# Patient Record
Sex: Female | Born: 1990 | Race: White | Hispanic: No | Marital: Married | Smoking: Current every day smoker
Health system: Southern US, Community
[De-identification: ages and names within clinical notes are randomized; demographics above are authoritative.]

## PROBLEM LIST (undated history)

## (undated) ENCOUNTER — Inpatient Hospital Stay: Payer: Self-pay

## (undated) DIAGNOSIS — F419 Anxiety disorder, unspecified: Secondary | ICD-10-CM

## (undated) DIAGNOSIS — F99 Mental disorder, not otherwise specified: Secondary | ICD-10-CM

## (undated) HISTORY — DX: Mental disorder, not otherwise specified: F99

## (undated) HISTORY — DX: Anxiety disorder, unspecified: F41.9

## (undated) HISTORY — PX: TONSILLECTOMY: SUR1361

---
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
## 2010-02-07 ENCOUNTER — Emergency Department: Payer: Self-pay | Admitting: Emergency Medicine

## 2010-02-14 ENCOUNTER — Emergency Department: Payer: Self-pay | Admitting: Emergency Medicine

## 2010-03-07 ENCOUNTER — Emergency Department: Payer: Self-pay | Admitting: Emergency Medicine

## 2011-10-25 ENCOUNTER — Emergency Department: Payer: Self-pay | Admitting: Emergency Medicine

## 2011-10-25 LAB — URINALYSIS, COMPLETE
Bilirubin,UR: NEGATIVE
Blood: NEGATIVE
Glucose,UR: NEGATIVE mg/dL (ref 0–75)
Nitrite: NEGATIVE
Ph: 6 (ref 4.5–8.0)
Protein: NEGATIVE
Specific Gravity: 1.029 (ref 1.003–1.030)
Squamous Epithelial: 7
WBC UR: 5 /HPF (ref 0–5)

## 2011-10-25 LAB — COMPREHENSIVE METABOLIC PANEL
Alkaline Phosphatase: 71 U/L (ref 50–136)
Anion Gap: 12 (ref 7–16)
BUN: 11 mg/dL (ref 7–18)
Chloride: 107 mmol/L (ref 98–107)
Co2: 24 mmol/L (ref 21–32)
Creatinine: 0.4 mg/dL — ABNORMAL LOW (ref 0.60–1.30)
EGFR (African American): >60
EGFR (Non-African Amer.): >60
Glucose: 83 mg/dL (ref 65–99)
Osmolality: 284 (ref 275–301)
Potassium: 4.7 mmol/L (ref 3.5–5.1)
SGOT(AST): 27 U/L (ref 15–37)
SGPT (ALT): 18 U/L

## 2011-10-25 LAB — CBC
HCT: 41.8 % (ref 35.0–47.0)
HGB: 13.7 g/dL (ref 12.0–16.0)
MCH: 28.2 pg (ref 26.0–34.0)
MCV: 86 fL (ref 80–100)
RBC: 4.86 10*6/uL (ref 3.80–5.20)

## 2011-10-25 LAB — WET PREP, GENITAL

## 2011-10-25 LAB — PREGNANCY, URINE: Pregnancy Test, Urine: POSITIVE m[IU]/mL

## 2011-10-26 LAB — HCG, QUANTITATIVE, PREGNANCY: Beta Hcg, Quant.: 1677 m[IU]/mL — ABNORMAL HIGH

## 2011-12-16 ENCOUNTER — Emergency Department: Payer: Self-pay | Admitting: Emergency Medicine

## 2011-12-17 LAB — URINALYSIS, COMPLETE
Bilirubin,UR: NEGATIVE
Blood: NEGATIVE
Glucose,UR: NEGATIVE mg/dL (ref 0–75)
Ketone: NEGATIVE
WBC UR: 612 /HPF (ref 0–5)

## 2011-12-17 LAB — CBC
HGB: 11.9 g/dL — ABNORMAL LOW (ref 12.0–16.0)
MCH: 28.4 pg (ref 26.0–34.0)
MCHC: 33.4 g/dL (ref 32.0–36.0)
Platelet: 189 10*3/uL (ref 150–440)
RBC: 4.18 10*6/uL (ref 3.80–5.20)
WBC: 8.3 10*3/uL (ref 3.6–11.0)

## 2011-12-17 LAB — HCG, QUANTITATIVE, PREGNANCY: Beta Hcg, Quant.: 85483 m[IU]/mL — ABNORMAL HIGH

## 2012-05-30 ENCOUNTER — Observation Stay: Payer: Self-pay | Admitting: Obstetrics & Gynecology

## 2012-05-30 LAB — URINALYSIS, COMPLETE
Blood: NEGATIVE
Ketone: NEGATIVE
Ph: 6 (ref 4.5–8.0)
Protein: 30
RBC,UR: 3 /HPF (ref 0–5)
Squamous Epithelial: 24

## 2012-06-03 ENCOUNTER — Observation Stay: Payer: Self-pay | Admitting: Obstetrics and Gynecology

## 2012-06-03 LAB — URINALYSIS, COMPLETE
Ketone: NEGATIVE
Nitrite: NEGATIVE
Ph: 6 (ref 4.5–8.0)
Protein: 30
RBC,UR: 4 /HPF (ref 0–5)
Squamous Epithelial: 29
WBC UR: 11 /HPF (ref 0–5)

## 2012-06-05 LAB — URINE CULTURE

## 2012-06-19 ENCOUNTER — Inpatient Hospital Stay: Payer: Self-pay | Admitting: Obstetrics & Gynecology

## 2012-06-19 LAB — CBC WITH DIFFERENTIAL/PLATELET
Basophil %: 1.3 %
Eosinophil %: 1.4 %
HCT: 37.9 % (ref 35.0–47.0)
HGB: 13 g/dL (ref 12.0–16.0)
Lymphocyte #: 2 10*3/uL (ref 1.0–3.6)
Lymphocyte %: 18.9 %
MCH: 29.9 pg (ref 26.0–34.0)
Monocyte #: 0.5 x10 3/mm (ref 0.2–0.9)
Monocyte %: 4.8 %
Neutrophil #: 7.8 10*3/uL — ABNORMAL HIGH (ref 1.4–6.5)
Neutrophil %: 73.6 %
Platelet: 132 10*3/uL — ABNORMAL LOW (ref 150–440)
RBC: 4.35 10*6/uL (ref 3.80–5.20)

## 2013-01-27 ENCOUNTER — Emergency Department: Payer: Self-pay | Admitting: Unknown Physician Specialty

## 2014-05-06 ENCOUNTER — Emergency Department: Payer: Self-pay | Admitting: Emergency Medicine

## 2014-12-14 NOTE — H&P (Signed)
L&D Evaluation:  History Expanded:   HPI 24 yo G1 at 36+ weeks EGA w crampy abd pain and DFM.  Today only.  Lower abd, moderate, no context, no modifiers, no other assoc symptoms.    [facility]    Presents with abdominal pain, decreased fetal movement    Patient's Medical History No Chronic Illness    Patient's Surgical History none    Medications Pre Natal Vitamins    Allergies NKDA    Social History none    Family History Non-Contributory   ROS:   ROS All systems were reviewed.  HEENT, CNS, GI, GU, Respiratory, CV, Renal and Musculoskeletal systems were found to be normal.   Exam:   Vital Signs stable    General no apparent distress    Mental Status clear    Abdomen gravid, non-tender    Estimated Fetal Weight Average for gestational age    Back no CVAT    Edema no edema    Pelvic no external lesions, cervix closed and thick    Mebranes Intact    FHT normal rate with no decels, Non-Stress Test Reactive    Ucx absent    Skin dry   Impression:   Impression decreased fetal movement, No signs of labor.   Plan:   Plan UA, EFM/NST, monitor contractions and for cervical change, fluids    Comments Non-Stress Test REACTIVE   Electronic Signatures: Letitia LibraHarris, Alexzander Dolinger Paul (MD)  (Signed 25-Oct-13 20:49)  Authored: L&D Evaluation   Last Updated: 25-Oct-13 20:49 by Letitia LibraHarris, Vane Yapp Paul (MD)

## 2014-12-14 NOTE — H&P (Signed)
L&D Evaluation:  History:   HPI 24 yo G1 at 7729w6d presents with c/o  contractions since yesterday evening.  [facility] PNL - A+, RI, VI, GBS neg    Presents with contractions    Patient's Medical History Bipolar d/o    Patient's Surgical History none    Medications Pre Natal Vitamins    Allergies NKDA    Social History none    Family History Non-Contributory   ROS:   ROS All systems were reviewed.  HEENT, CNS, GI, GU, Respiratory, CV, Renal and Musculoskeletal systems were found to be normal.   Exam:   Vital Signs stable    General no apparent distress    Mental Status nl effort    Abdomen gravid, non-tender    Estimated Fetal Weight Average for gestational age    Pelvic no external lesions, 4-5cm per RN    Mebranes Intact    FHT normal rate with no decels, Non-Stress Test Reactive    Ucx q755min    Skin dry   Impression:   Impression active labor   Plan:   Plan EFM/NST, monitor contractions and for cervical change    Comments continue expectant management   Electronic Signatures: Orvan FalconerStansbury Clipp, Sharonann Malbrough K (MD)  (Signed [phone]-Nov-13 07:46)  Authored: L&D Evaluation   Last Updated: 14-Nov-13 07:46 by Garnette GunnerStansbury Clipp, Ali LoweEryn K (MD)

## 2014-12-14 NOTE — H&P (Signed)
L&D Evaluation:  History:   HPI 24 yo G1 at 36+ weeks with lower abdominal pelvic pain. Recently evaluated on L&D and given rx for macrobid for presumptive UTI.  No culture sent at that visit.  Now re-presents with continued abdominal pain and dysuria.  +FM, no LOF, no VB    Presents with abdominal pain, dysuria    Patient's Medical History No Chronic Illness    Patient's Surgical History none    Medications Pre Natal Vitamins    Allergies NKDA    Social History none    Family History Non-Contributory   ROS:   ROS All systems were reviewed.  HEENT, CNS, GI, GU, Respiratory, CV, Renal and Musculoskeletal systems were found to be normal.   Exam:   Vital Signs stable    General no apparent distress    Mental Status no increased work of breathing    Abdomen gravid, non-tender, mild suprpubic tenderness on exam    Estimated Fetal Weight Average for gestational age    Back no CVAT    Edema no edema    Pelvic no external lesions, 1-2cm/50/-3    Mebranes Intact    FHT normal rate with no decels, Non-Stress Test Reactive    Ucx absent    Skin dry   Impression:   Impression UTI, decreased fetal movement   Plan:   Plan UA, EFM/NST, monitor contractions and for cervical change    Comments - Non-Stress Test REACTIVE - No cervical change on recheck - UA still looks like possible UTI, urine turbid.  Switch to bactrim DS bid x 7 days and urine culture sent - Has follow up tomorrow    Follow Up Appointment already scheduled   Electronic Signatures: Lorrene ReidStaebler, Jocelynn Gioffre M (MD)  (Signed 29-Oct-13 23:45)  Authored: L&D Evaluation   Last Updated: 29-Oct-13 23:45 by Lorrene ReidStaebler, Brandie Lopes M (MD)

## 2014-12-23 ENCOUNTER — Emergency Department
Admission: EM | Admit: 2014-12-23 | Discharge: 2014-12-23 | Disposition: A | Discharge status: Home / Self Care | Payer: Medicaid Other | Attending: Emergency Medicine | Admitting: Emergency Medicine

## 2014-12-23 ENCOUNTER — Encounter: Payer: Self-pay | Admitting: Emergency Medicine

## 2014-12-23 DIAGNOSIS — Z008 Encounter for other general examination: Secondary | ICD-10-CM

## 2014-12-23 DIAGNOSIS — Z720 Tobacco use: Secondary | ICD-10-CM | POA: Insufficient documentation

## 2014-12-23 DIAGNOSIS — N390 Urinary tract infection, site not specified: Secondary | ICD-10-CM | POA: Diagnosis not present

## 2014-12-23 DIAGNOSIS — Z0289 Encounter for other administrative examinations: Secondary | ICD-10-CM | POA: Insufficient documentation

## 2014-12-23 DIAGNOSIS — Z0283 Encounter for blood-alcohol and blood-drug test: Secondary | ICD-10-CM | POA: Diagnosis present

## 2014-12-23 LAB — URINALYSIS COMPLETE WITH MICROSCOPIC (ARMC ONLY)
BILIRUBIN URINE: NEGATIVE
Glucose, UA: NEGATIVE mg/dL
KETONES UR: NEGATIVE mg/dL
Nitrite: POSITIVE — AB
Protein, ur: NEGATIVE mg/dL
SPECIFIC GRAVITY, URINE: 1.015 (ref 1.005–1.030)
pH: 5 (ref 5.0–8.0)

## 2014-12-23 LAB — CBC
HCT: 42 % (ref 35.0–47.0)
Hemoglobin: 13.5 g/dL (ref 12.0–16.0)
MCH: 27.3 pg (ref 26.0–34.0)
MCHC: 32.1 g/dL (ref 32.0–36.0)
MCV: 85 fL (ref 80.0–100.0)
Platelets: 190 10*3/uL (ref 150–440)
RBC: 4.94 MIL/uL (ref 3.80–5.20)
RDW: 13.2 % (ref 11.5–14.5)
WBC: 5.7 10*3/uL (ref 3.6–11.0)

## 2014-12-23 LAB — COMPREHENSIVE METABOLIC PANEL
ALT: 19 U/L (ref 14–54)
ANION GAP: 6 (ref 5–15)
AST: 27 U/L (ref 15–41)
Albumin: 4.4 g/dL (ref 3.5–5.0)
Alkaline Phosphatase: 87 U/L (ref 38–126)
BILIRUBIN TOTAL: 0.6 mg/dL (ref 0.3–1.2)
BUN: 12 mg/dL (ref 6–20)
CO2: 24 mmol/L (ref 22–32)
CREATININE: 0.8 mg/dL (ref 0.44–1.00)
Calcium: 8.9 mg/dL (ref 8.9–10.3)
Chloride: 106 mmol/L (ref 101–111)
GFR calc Af Amer: >60 mL/min (ref >60)
GLUCOSE: 83 mg/dL (ref 65–99)
Potassium: 4 mmol/L (ref 3.5–5.1)
SODIUM: 136 mmol/L (ref 135–145)
Total Protein: 7.3 g/dL (ref 6.5–8.1)

## 2014-12-23 LAB — SALICYLATE LEVEL: Salicylate Lvl: <4 mg/dL (ref 2.8–30.0)

## 2014-12-23 LAB — POCT PREGNANCY, URINE: PREG TEST UR: NEGATIVE

## 2014-12-23 LAB — ETHANOL

## 2014-12-23 LAB — ACETAMINOPHEN LEVEL

## 2014-12-23 MED ORDER — CEPHALEXIN 500 MG PO CAPS: 500.0000 mg | ORAL_CAPSULE | Freq: Three times a day (TID) | ORAL | Status: AC

## 2014-12-23 NOTE — ED Notes (Signed)
BEHAVIORAL HEALTH ROUNDING Patient sleeping: No. Patient alert and oriented: yes Behavior appropriate: No., pt yelling profanity Nutrition and fluids offered: Yes  Toileting and hygiene offered: Yes  Sitter present: yes Law enforcement present: Yes   ENVIRONMENTAL ASSESSMENT Potentially harmful objects out of patient reach: Yes.   Personal belongings secured: Yes.   Patient dressed in hospital provided attire only: No. Plastic bags out of patient reach: Yes.   Patient care equipment (cords, cables, call bells, lines, and drains) shortened, removed, or accounted for: Yes.   Equipment and supplies removed from bottom of stretcher: Yes.   Potentially toxic materials out of patient reach: Yes.   Sharps container removed or out of patient reach: Yes.

## 2014-12-23 NOTE — ED Provider Notes (Signed)
[facility] Emergency Department Provider Note  ____________________________________________  Time seen: Approximately 3:56 PM  I have reviewed the triage vital signs and the nursing notes.   HISTORY  Chief Complaint Drug / Alcohol Assessment    HPI Marilyn Gonzalez is a 24 y.o. female [place] [facility] before being taken to jail. Patient is apparently under arrest for impaired driving. Patient reports she took some Xanax that she has a prescription for it is taking anything else she denies any injuries or medical problems patient is still somewhat sleepy although by her description much less sleepy and she was. She is not having any pain coughing or any other complaints   History reviewed. No pertinent past medical history.  There are no active problems to display for this patient.   History reviewed. No pertinent past surgical history.  No current outpatient prescriptions on file.  Allergies Review of patient's allergies indicates not on file.  History reviewed. No pertinent family history.  Social History History  Substance Use Topics  . Smoking status: Current Every Day Smoker  . Smokeless tobacco: Not on file  . Alcohol Use: Yes    Review of Systems Constitutional: No fever/chills Eyes: No visual changes. ENT: No sore throat. Cardiovascular: Denies chest pain. Respiratory: Denies shortness of breath. Gastrointestinal: No abdominal pain.  No nausea, no vomiting.  No diarrhea.  No constipation. Genitourinary: Negative for dysuria. Musculoskeletal: Negative for back pain. Skin: Negative for rash. Neurological: Negative for headaches, focal weakness or numbness.  10-point ROS otherwise negative.  ____________________________________________   PHYSICAL EXAM:  VITAL SIGNS: ED Triage Vitals  Enc Vitals Group     BP 12/23/14 1316 98/63 mmHg     Pulse Rate 12/23/14 1316 69     Resp 12/23/14 1316 18      Temp 12/23/14 1316 97.9 F (36.6 C)     Temp src --      SpO2 12/23/14 1316 100 %     Weight 12/23/14 1316 132 lb (59.875 kg)     Height 12/23/14 1316 5\' 4"  (1.626 m)     Head Cir --      Peak Flow --      Pain Score 12/23/14 1347 Asleep     Pain Loc --      Pain Edu? --      Excl. in GC? --     Constitutional: Alert and oriented. Well appearing and in no acute distress. Eyes: Conjunctivae are normal. PERRL. EOMI. Head: Atraumatic. Nose: No congestion/rhinnorhea. Mouth/Throat: Mucous membranes are moist.  Oropharynx non-erythematous. Neck: No stridor.   Cardiovascular: Normal rate, regular rhythm. Grossly normal heart sounds.  Good peripheral circulation. Respiratory: Normal respiratory effort.  No retractions. Lungs CTAB. Gastrointestinal: Soft and nontender. No distention. No abdominal bruits. No CVA tenderness. Labs are essentially within normal Musculoskeletal: No lower extremity tenderness nor edema.  No joint effusions. Neurologic:  Normal speech and language. No gross focal neurologic deficits are appreciated. Speech is normal. No gait instability. Skin:  Skin is warm, dry and intact. No rash noted. Psychiatric: Mood and affect are normal. Speech and behavior are normal.  ____________________________________________   LABS (all labs ordered are listed, but only abnormal results are displayed)  Labs Reviewed  ACETAMINOPHEN LEVEL - Abnormal; Notable for the following:    Acetaminophen (Tylenol), Serum <10 (*)    All other components within normal limits  CBC  COMPREHENSIVE METABOLIC PANEL  ETHANOL  SALICYLATE LEVEL  URINE DRUG  SCREEN, QUALITATIVE ([facility])  URINALYSIS COMPLETEWITH MICROSCOPIC ([facility])   POC URINE PREG, ED  POC URINE PREG, ED   ____________________________________________  EKG  No EKG was done ____________________________________________  RADIOLOGY  No x-rays were  done ____________________________________________   PROCEDURES  Procedure(s) performed: None  Critical Care performed: No  ____________________________________________   INITIAL IMPRESSION / ASSESSMENT AND PLAN / ED COURSE  Pertinent labs & imaging results that were available during my care of the patient were reviewed by me and considered in my medical decision making (see chart for details).  Labs currently are essentially within normal limits 2 urine studies are not back yet.  ----------------------------------------- 5:01 PM on 12/23/2014 -----------------------------------------  Labs are now all back patient has red and white blood cells in her urine she complains of dysuria urgency frequency for approximately a week denies any chance of STD we will give her some antibiotics for her UTI she is to follow-up with us or her doctor in in 2 days unless she's better  ____________________________________________   FINAL CLINICAL IMPRESSION(S) / ED DIAGNOSES  Final diagnoses:  Medical clearance for incarceration  UTI (lower urinary tract infection)     Arnaldo NatalPaul F Malinda, MD 12/23/14 1816

## 2014-12-23 NOTE — ED Notes (Signed)
Pt arrives in police custody, pt states "they state I am f--- high, high on what? Take me to jail", pt yelling profanity and displaying aggressive behavior, PD states she was arrested for DWI

## 2014-12-23 NOTE — ED Notes (Signed)
Pt brought in under arrest by [employer], for driving under the influence. Pt states to this RN that she took "2 Footballs of Xanax." Pt is very sleepy but cooperative. Drew pts blood for the officer. Pt needs to be cleared to go to jail.

## 2014-12-23 NOTE — Discharge Instructions (Signed)
Antibiotic Medication Antibiotics are among the most frequently prescribed medicines. Antibiotics cure illness by assisting our body to injure or kill the bacteria that cause infection. While antibiotics are useful to treat a wide variety of infections they are useless against viruses. Antibiotics cannot cure colds, flu, or other viral infections.  There are many types of antibiotics available. Your caregiver will decide which antibiotic will be useful for an illness. Never take or give someone else's antibiotics or left over medicine. Your caregiver may also take into account:  Allergies.  The cost of the medicine.  Dosing schedules.  Taste.  Common side effects when choosing an antibiotic for an infection. Ask your caregiver if you have questions about why a certain medicine was chosen. HOME CARE INSTRUCTIONS Read all instructions and labels on medicine bottles carefully. Some antibiotics should be taken on an empty stomach while others should be taken with food. Taking antibiotics incorrectly may reduce how well they work. Some antibiotics need to be kept in the refrigerator. Others should be kept at room temperature. Ask your caregiver or pharmacist if you do not understand how to give the medicine. Be sure to give the amount of medicine your caregiver has prescribed. Even if you feel better and your symptoms improve, bacteria may still remain alive in the body. Taking all of the medicine will prevent:  The infection from returning and becoming harder to treat.  Complications from partially treated infections. If there is any medicine left over after you have taken the medicine as your caregiver has instructed, throw the medicine away. Be sure to tell your caregiver if you:  Are allergic to any medicines.  Are pregnant or intend to become pregnant while using this medicine.  Are breastfeeding.  Are taking any other prescription, non-prescription medicine, or herbal  remedies.  Have any other medical conditions or problems you have not already discussed. If you are taking birth control pills, they may not work while you are on antibiotics. To avoid unwanted pregnancy:  Continue taking your birth control pills as usual.  Use a second form of birth control (such as condoms) while you are taking antibiotic medicine.  When you finish taking the antibiotic medicine, continue using the second form of birth control until you are finished with your current 1 month cycle of birth control pills. Try not to miss any doses of medicine. If you miss a dose, take it as soon as possible. However, if it is almost time for the next dose and the dosing schedule is:  2 doses a day, take the missed dose and the next dose 5 to 6 hours apart.  3 or more doses a day, take the missed dose and the next dose 2 to 4 hours apart, then go back to the normal schedule.  If you are unable to make up a missed dose, take the next scheduled dose on time and complete the missed dose at the end of the prescribed time for your medicine. SIDE EFFECTS TO TAKING ANTIBIOTICS Common side effects to antibiotic use include:  Soft stools or diarrhea.  Mild stomach upset.  Sun sensitivity. SEEK MEDICAL CARE IF:   If you get worse or do not improve within a few days of starting the medicine.  Vomiting develops.  Diaper rash or rash on the genitals appears.  Vaginal itching occurs.  White patches appear on the tongue or in the mouth.  Severe watery diarrhea and abdominal cramps occur.  Signs of an allergy develop (hives, unknown  itchy rash appears). STOP TAKING THE ANTIBIOTIC. SEEK IMMEDIATE MEDICAL CARE IF:   Urine turns dark or blood colored.  Skin turns yellow.  Easy bruising or bleeding occurs.  Joint pain or muscle aches occur.  Fever returns.  Severe headache occurs.  Signs of an allergy develop (trouble breathing, wheezing, swelling of the lips, face or tongue,  fainting, or blisters on the skin or in the mouth). STOP TAKING THE ANTIBIOTIC. Document Released: 04/04/2004 Document Revised: 10/15/2011 Document Reviewed: 04/14/2009 [facility] Patient Information 2015 [employer] [place]. This information is not intended to replace advice given to you by your health care provider. Make sure you discuss any questions you have with your health care provider.   Take keflex 1 pill 3 x a day for 7 days to treat the uti.  Please return of follow up with your doctor or urgent care if your symptoms are no better in 2-3 days.

## 2015-02-08 ENCOUNTER — Encounter: Payer: Self-pay | Admitting: *Deleted

## 2015-02-08 ENCOUNTER — Telehealth: Payer: Self-pay | Admitting: *Deleted

## 2015-02-08 ENCOUNTER — Ambulatory Visit: Payer: Self-pay | Admitting: Obstetrics and Gynecology

## 2015-02-08 ENCOUNTER — Other Ambulatory Visit: Payer: Self-pay | Admitting: Obstetrics and Gynecology

## 2015-02-08 MED ORDER — ALPRAZOLAM 2 MG PO TABS: 2.0000 mg | ORAL_TABLET | Freq: Two times a day (BID) | ORAL | Status: DC

## 2015-02-08 MED ORDER — QUETIAPINE FUMARATE 50 MG PO TABS: 50.0000 mg | ORAL_TABLET | Freq: Every day | ORAL | Status: DC

## 2015-02-08 MED ORDER — FLUOXETINE HCL 20 MG PO CAPS: 20.0000 mg | ORAL_CAPSULE | Freq: Every day | ORAL | Status: DC

## 2015-02-08 NOTE — Telephone Encounter (Signed)
Please let her know I did one months refill and she has to take care of this immediately or have them write her prescriptions

## 2015-02-08 NOTE — Telephone Encounter (Signed)
Pt was on MNB schedule for 2:00, pt couldn't be seen due to [facility] being on her Medicaid card, pt would like refill on her xanax, seroquel, prozac until she gets this situation Straightened out, please advise

## 2015-02-09 NOTE — Telephone Encounter (Signed)
Medication faxed to pts pharmacy

## 2015-02-09 NOTE — Telephone Encounter (Signed)
triedd calling to let her know and the number is not accepting any calls at this time.

## 2015-02-21 ENCOUNTER — Emergency Department: Payer: Medicaid Other

## 2015-02-21 ENCOUNTER — Emergency Department
Admission: EM | Admit: 2015-02-21 | Discharge: 2015-02-21 | Discharge status: Against Medical Advice | Payer: Medicaid Other | Attending: Emergency Medicine | Admitting: Emergency Medicine

## 2015-02-21 ENCOUNTER — Encounter: Payer: Self-pay | Admitting: Emergency Medicine

## 2015-02-21 DIAGNOSIS — Z3A01 Less than 8 weeks gestation of pregnancy: Secondary | ICD-10-CM | POA: Insufficient documentation

## 2015-02-21 DIAGNOSIS — F17210 Nicotine dependence, cigarettes, uncomplicated: Secondary | ICD-10-CM | POA: Diagnosis not present

## 2015-02-21 DIAGNOSIS — O99331 Smoking (tobacco) complicating pregnancy, first trimester: Secondary | ICD-10-CM | POA: Insufficient documentation

## 2015-02-21 DIAGNOSIS — Z79899 Other long term (current) drug therapy: Secondary | ICD-10-CM | POA: Insufficient documentation

## 2015-02-21 DIAGNOSIS — O9989 Other specified diseases and conditions complicating pregnancy, childbirth and the puerperium: Secondary | ICD-10-CM | POA: Diagnosis present

## 2015-02-21 DIAGNOSIS — R109 Unspecified abdominal pain: Secondary | ICD-10-CM | POA: Insufficient documentation

## 2015-02-21 DIAGNOSIS — Z3491 Encounter for supervision of normal pregnancy, unspecified, first trimester: Secondary | ICD-10-CM

## 2015-02-21 LAB — URINALYSIS COMPLETE WITH MICROSCOPIC (ARMC ONLY)
BILIRUBIN URINE: NEGATIVE
Bacteria, UA: NONE SEEN
GLUCOSE, UA: NEGATIVE mg/dL
Hgb urine dipstick: NEGATIVE
KETONES UR: NEGATIVE mg/dL
Nitrite: NEGATIVE
PH: 5 (ref 5.0–8.0)
Protein, ur: NEGATIVE mg/dL
SPECIFIC GRAVITY, URINE: 1.027 (ref 1.005–1.030)

## 2015-02-21 LAB — POCT PREGNANCY, URINE: PREG TEST UR: POSITIVE — AB

## 2015-02-21 LAB — HCG, QUANTITATIVE, PREGNANCY: hCG, Beta Chain, Quant, S: 502 m[IU]/mL — ABNORMAL HIGH (ref <5)

## 2015-02-21 NOTE — ED Notes (Signed)
Pt took two preg tests at home and were positive, wants to know for sure if she is pregnant.

## 2015-02-21 NOTE — ED Notes (Signed)
In with pt, discussed her levels and that she is still waiting on ultrasound, pt wants to leave, saying  that she will go to the health department tommorrow. Asked her to wait so I can go to talk with EDP, out of room to notify edp of the same, and the pt walks out of the room and says she is leaving because her ride is here.

## 2015-02-21 NOTE — ED Provider Notes (Signed)
[facility] Emergency Department Provider Note  ____________________________________________  Time seen: 8:10 PM  I have reviewed the triage vital signs and the nursing notes.   HISTORY  Chief Complaint Possible Pregnancy      HPI Marilyn Gonzalez is a 24 y.o. female presents with history of positive home pregnancy tests 2 at home. Patient states "I just want to make sure pregnant". Patient also admits to left pelvic pain. Patient denies any vaginal bleeding. No fever no nausea no vomiting and no dysuria.     Past Medical History  Diagnosis Date  . Anxiety     There are no active problems to display for this patient.   Past Surgical History  Procedure Laterality Date  . Tonsillectomy      Current Outpatient Rx  Name  Route  Sig  Dispense  Refill  . alprazolam (XANAX) 2 MG tablet   Oral   Take 1 tablet (2 mg total) by mouth 2 (two) times daily.   30 tablet   0   . FLUoxetine (PROZAC) 20 MG capsule   Oral   Take 1 capsule (20 mg total) by mouth daily.   30 capsule   0   . QUEtiapine (SEROQUEL) 50 MG tablet   Oral   Take 1 tablet (50 mg total) by mouth at bedtime.   30 tablet   0     Allergies Review of patient's allergies indicates no known allergies.  Family History  Problem Relation Age of Onset  . Heart disease Maternal Grandmother   . Diabetes Maternal Grandmother   . Heart disease Paternal Grandfather     Social History History  Substance Use Topics  . Smoking status: Current Every Day Smoker  . Smokeless tobacco: Never Used  . Alcohol Use: Yes    Review of Systems  Constitutional: Negative for fever. Eyes: Negative for visual changes. ENT: Negative for sore throat. Cardiovascular: Negative for chest pain. Respiratory: Negative for shortness of breath. Gastrointestinal: Negative for abdominal pain, vomiting and diarrhea. Genitourinary: Negative for dysuria. Positive for pelvic pain on the left   Musculoskeletal: Negative for back pain. Skin: Negative for rash. Neurological: Negative for headaches, focal weakness or numbness.   10-point ROS otherwise negative.  ____________________________________________   PHYSICAL EXAM:  VITAL SIGNS: ED Triage Vitals  Enc Vitals Group     BP 02/21/15 1836 99/67 mmHg     Pulse Rate 02/21/15 1836 77     Resp 02/21/15 1836 18     Temp 02/21/15 1836 98.1 F (36.7 C)     Temp Source 02/21/15 1836 Oral     SpO2 02/21/15 1836 100 %     Weight 02/21/15 1836 116 lb (52.617 kg)     Height 02/21/15 1836 5\' 3"  (1.6 m)     Head Cir --      Peak Flow --      Pain Score 02/21/15 1957 4     Pain Loc --      Pain Edu? --      Excl. in GC? --      Constitutional: Alert and oriented. Well appearing and in no distress. Eyes: Conjunctivae are normal. PERRL. Normal extraocular movements. ENT   Head: Normocephalic and atraumatic.   Nose: No congestion/rhinnorhea.   Mouth/Throat: Mucous membranes are moist.   Neck: No stridor. Hematological/Lymphatic/Immunilogical: No cervical lymphadenopathy. Cardiovascular: Normal rate, regular rhythm. Normal and symmetric distal pulses are present in all extremities. No murmurs, rubs, or gallops. Respiratory: Normal respiratory  effort without tachypnea nor retractions. Breath sounds are clear and equal bilaterally. No wheezes/rales/rhonchi. Gastrointestinal: Soft and nontender. No distention. There is no CVA tenderness. left pelvic pain with palpation Genitourinary: deferred Musculoskeletal: Nontender with normal range of motion in all extremities. No joint effusions.  No lower extremity tenderness nor edema. Neurologic:  Normal speech and language. No gross focal neurologic deficits are appreciated. Speech is normal.  Skin:  Skin is warm, dry and intact. No rash noted. Psychiatric: Mood and affect are normal. Speech and behavior are normal. Patient exhibits appropriate insight and  judgment.  ____________________________________________    LABS (pertinent positives/negatives)  Labs Reviewed  URINALYSIS COMPLETEWITH MICROSCOPIC ([facility] ONLY) - Abnormal; Notable for the following:    Color, Urine YELLOW (*)    APPearance HAZY (*)    Leukocytes, UA 1+ (*)    Squamous Epithelial / LPF 6-30 (*)    All other components within normal limits  POCT PREGNANCY, URINE - Abnormal; Notable for the following:    Preg Test, Ur POSITIVE (*)    All other components within normal limits  HCG, QUANTITATIVE, PREGNANCY  POC URINE PREG, ED   Labs Reviewed  URINALYSIS COMPLETEWITH MICROSCOPIC ([facility] ONLY) - Abnormal; Notable for the following:    Color, Urine YELLOW (*)    APPearance HAZY (*)    Leukocytes, UA 1+ (*)    Squamous Epithelial / LPF 6-30 (*)    All other components within normal limits  HCG, QUANTITATIVE, PREGNANCY - Abnormal; Notable for the following:    hCG, Beta Chain, Quant, S 502 (*)    All other components within normal limits  POCT PREGNANCY, URINE - Abnormal; Notable for the following:    Preg Test, Ur POSITIVE (*)    All other components within normal limits  POC URINE PREG, ED        INITIAL IMPRESSION / ASSESSMENT AND PLAN / ED COURSE  Pertinent labs & imaging results that were available during my care of the patient were reviewed by me and considered in my medical decision making (see chart for details). While awaiting for ultrasound to be performed the patient stated that she had to leave and that she would follow-up with the health Department because she was unwilling to wait. I informed the patient of the risk of an ectopic pregnancy however she was adamant that she wanted to leave.   ____________________________________________   FINAL CLINICAL IMPRESSION(S) / ED DIAGNOSES  Final diagnoses:  First trimester pregnancy      Darci Current, MD 02/21/15 2147

## 2015-02-21 NOTE — ED Notes (Signed)
States she is having some abd pain and had positive home preg test

## 2015-02-21 NOTE — ED Notes (Signed)
MD at bedside. 

## 2015-03-01 ENCOUNTER — Ambulatory Visit: Payer: Self-pay | Admitting: Obstetrics and Gynecology

## 2015-03-03 ENCOUNTER — Ambulatory Visit: Payer: Medicaid Other | Admitting: Obstetrics and Gynecology

## 2015-03-03 ENCOUNTER — Encounter: Payer: Self-pay | Admitting: Obstetrics and Gynecology

## 2015-03-03 VITALS — BP 110/82 | HR 88 | Ht 63.0 in | Wt 107.6 lb

## 2015-03-03 DIAGNOSIS — R300 Dysuria: Secondary | ICD-10-CM

## 2015-03-03 DIAGNOSIS — N926 Irregular menstruation, unspecified: Secondary | ICD-10-CM

## 2015-03-03 LAB — POCT URINALYSIS DIPSTICK
GLUCOSE UA: NEGATIVE
KETONES UA: NEGATIVE
LEUKOCYTES UA: NEGATIVE
Nitrite, UA: POSITIVE
RBC UA: NEGATIVE
SPEC GRAV UA: 1.015
UROBILINOGEN UA: 0.2
pH, UA: 6.5

## 2015-03-03 LAB — POCT URINE PREGNANCY: PREG TEST UR: POSITIVE — AB

## 2015-03-03 MED ORDER — AMOXICILLIN 500 MG PO CAPS: 500.0000 mg | ORAL_CAPSULE | Freq: Three times a day (TID) | ORAL | Status: DC

## 2015-03-03 NOTE — Progress Notes (Signed)
Subjective:     Patient ID: Marilyn Gonzalez, female   DOB: 1991/04/15, 24 y.o.   MRN: [id number]  HPI Here for confirmation of pregnacy  Review of Systems RLQ pain and dysuria. Missed menses with +UPT at ED on 02/21/15 with quant BHCG showing [redacted] weeks pregnant. Unsure LMP    Objective:   Physical Exam UA + nitrates PE defered    Assessment:     Early pregancy UTI     Plan:     rx sent in for amoxil  tid x 7d Urine sent for culture Repeated quant Bhcg Ultrasound scheduled for 6 weeks for viability and dates PNV samples given.  Salvatrice Morandi Ines Bloomer, CNM

## 2015-03-05 LAB — BETA HCG QUANT (REF LAB): HCG QUANT: 15222 m[IU]/mL

## 2015-03-05 LAB — SPECIMEN STATUS REPORT

## 2015-03-06 ENCOUNTER — Other Ambulatory Visit: Payer: Self-pay | Admitting: Obstetrics and Gynecology

## 2015-03-06 LAB — URINE CULTURE

## 2015-03-06 MED ORDER — CIPROFLOXACIN HCL 500 MG PO TABS
500.0000 mg | ORAL_TABLET | Freq: Two times a day (BID) | ORAL | Status: DC
Start: 2015-03-06 — End: 2015-05-25

## 2015-03-07 ENCOUNTER — Other Ambulatory Visit: Payer: Self-pay | Admitting: *Deleted

## 2015-03-07 ENCOUNTER — Telehealth: Payer: Self-pay | Admitting: *Deleted

## 2015-03-07 DIAGNOSIS — N926 Irregular menstruation, unspecified: Secondary | ICD-10-CM

## 2015-03-07 NOTE — Telephone Encounter (Signed)
Notified pt of lab results, Marilyn Gonzalez is going to move her appt for this week

## 2015-03-09 ENCOUNTER — Ambulatory Visit: Payer: Medicaid Other

## 2015-03-09 DIAGNOSIS — N926 Irregular menstruation, unspecified: Secondary | ICD-10-CM | POA: Diagnosis not present

## 2015-03-23 ENCOUNTER — Ambulatory Visit: Payer: Medicaid Other | Admitting: Obstetrics and Gynecology

## 2015-03-23 ENCOUNTER — Other Ambulatory Visit: Payer: Medicaid Other

## 2015-04-04 ENCOUNTER — Ambulatory Visit (INDEPENDENT_AMBULATORY_CARE_PROVIDER_SITE_OTHER): Payer: Medicaid Other | Admitting: Obstetrics and Gynecology

## 2015-04-04 VITALS — BP 110/80 | HR 70 | Wt 121.6 lb

## 2015-04-04 DIAGNOSIS — Z36 Encounter for antenatal screening of mother: Secondary | ICD-10-CM

## 2015-04-04 DIAGNOSIS — Z369 Encounter for antenatal screening, unspecified: Secondary | ICD-10-CM

## 2015-04-04 NOTE — Patient Instructions (Signed)
Pt to call back with any concerns or question she may have before appt. Pt advised on miscarriage precautions.

## 2015-04-04 NOTE — Progress Notes (Cosign Needed)
Marilyn Gonzalez for NOB nurse interview visit. G2. P1. Pregnancy eduction material explained and given. No cats in the home. NOB labs ordered. HIV labs and Drug screen were explained optional and she could opt out of tests but did not decline. Drug screen ordered and sent to lab. PNV encouraged. NT declined. Pt. To follow up with provider in 3 weeks for NOB physical.  All questions answered.

## 2015-04-12 ENCOUNTER — Encounter: Payer: Self-pay | Admitting: Obstetrics and Gynecology

## 2015-04-26 ENCOUNTER — Ambulatory Visit (INDEPENDENT_AMBULATORY_CARE_PROVIDER_SITE_OTHER): Payer: Medicaid Other | Admitting: Obstetrics and Gynecology

## 2015-04-26 ENCOUNTER — Encounter: Payer: Self-pay | Admitting: Obstetrics and Gynecology

## 2015-04-26 VITALS — BP 104/63 | HR 105 | Wt 124.7 lb

## 2015-04-26 DIAGNOSIS — Z3492 Encounter for supervision of normal pregnancy, unspecified, second trimester: Secondary | ICD-10-CM | POA: Diagnosis not present

## 2015-04-26 DIAGNOSIS — O99320 Drug use complicating pregnancy, unspecified trimester: Secondary | ICD-10-CM

## 2015-04-26 DIAGNOSIS — F1120 Opioid dependence, uncomplicated: Secondary | ICD-10-CM

## 2015-04-26 DIAGNOSIS — F1121 Opioid dependence, in remission: Secondary | ICD-10-CM

## 2015-04-26 DIAGNOSIS — Z720 Tobacco use: Secondary | ICD-10-CM | POA: Insufficient documentation

## 2015-04-26 DIAGNOSIS — O99322 Drug use complicating pregnancy, second trimester: Secondary | ICD-10-CM

## 2015-04-26 LAB — POCT URINALYSIS DIPSTICK
BILIRUBIN UA: NEGATIVE
Glucose, UA: NEGATIVE
Ketones, UA: NEGATIVE
NITRITE UA: NEGATIVE
PH UA: 6
RBC UA: NEGATIVE
Spec Grav, UA: 1.025
UROBILINOGEN UA: 0.2

## 2015-04-26 MED ORDER — BUPRENORPHINE HCL-NALOXONE HCL 2-0.5 MG SL SUBL: 1.0000 | SUBLINGUAL_TABLET | Freq: Every day | SUBLINGUAL | Status: DC

## 2015-04-26 MED ORDER — INFLUENZA VAC SPLIT QUAD 0.5 ML IM SUSY
0.5000 mL | PREFILLED_SYRINGE | Freq: Once | INTRAMUSCULAR | Status: AC
  Administered 2015-04-26: 0.5 mL via INTRAMUSCULAR

## 2015-04-26 NOTE — Patient Instructions (Signed)
Genetic Counseling Genetic counseling helps people look at the risks of genetic problems in pregnancy. Genetic problems are problems that may be passed from the mother and father to their child.  Genetic counseling is done:  To see if a couple is at risk of passing on a problem.  To explain the cause of a disorder if present.  To understand the odds passing it on to the baby.  To determine what can be done medically.  To use in family planning to prevent or avoid a problem. Genetic counseling can be done by:  A geneticist.  A physician with special training in genetics.  Genetic counselors. The following are reasons to seek a referral for genetic counseling and/or genetic evaluation with a genetic physician: FAMILY HISTORY FACTORS   Mental retardation.  Neural tube defects (such as spina bifida).  Chromosome abnormalities (such as Down syndrome, Fragile X syndrome).  Cleft lip/cleft palate.  Heart defects.  Short stature.  Single gene defects (such as cystic fibrosis or PKU).  Hearing or visual impairments.  Learning disabilities.  Psychiatric disorders.  Cancers (breast, uterus, ovary and intestine).  Multiple pregnancy losses (miscarriages, stillbirths, or infant deaths).  Other disorders which could be considered genetic.  Either parent with a dominant disorder or any disorder seen in several generations including:  High blood pressure.  High cholesterol.  Muscular dystrophy.  Huntington's chorea.  Polycystic kidney. IF BOTH PARENTS ARE CARRIERS FOR:   Depression.  Seizures (convulsions).  Alcoholism.  Diabetes.  Thyroid disorder.  Others in which birth defects may be associated either with the disease process or with common medications prescribed for the disease.  Fetal or parental exposure to potentially harmful agents. These include:  Drugs.  Chemicals.  Infection.  Radiation.  Infertility cases where either parent is suspected  of having a chromosome abnormality.  Couples requiring assisted reproductive techniques to achieve a pregnancy, or individuals donating eggs or sperm for those purposes. OTHER FACTORS   Persons in specific ethnic groups or geographic areas with a higher incidence of certain disorders. These include Tay Sachs disease, sickle cell disease, or thalassemia.  Extreme parental concern or fear of having a child with a birth defect.  Parents are blood relatives (consanguinity) or incest in a pregnancy is involved.  Premarital or preconception counseling in couples at high risk for genetic disorders based on family or personal medical history.  The birth of an affected child or by carrier screening.  Mother, known, or presumed carrier of an X-linked disorder such as hemophilia.  Either parent is a known carrier of a balanced chromosome abnormality.  A previous baby with a genetic disorder.  After the baby is born, you find out the baby has a genetic disorder. PREGNANCY FACTORS   The mother is over 35 years old a the time of delivery.  Maternal serum screening indicating an increased risk for neural tube defects, Down Syndrome, or trisomy 18.  Abnormal prenatal diagnostic test results or abnormal prenatal ultrasound examination.  Maternal factors such as:  Schizophrenia.  Seizures.  Depression.  Alcoholism.  Diabetes.  Thyroid disorder.  Other factors in which birth defects may be associated either with the disease process or with common medications prescribed for the disease.  Fetal or parental exposure to potentially harmful agents such as:  Drugs.  Chemicals.  Radiation.  Infection.  The father is over the age of 55 years at the time of conception.  Infertility cases where either parent is suspected of having a chromosome abnormality.    Couples requiring assisted reproductive techniques to achieve a pregnancy, or individuals donating eggs or sperm for those  purposes.  Recurrent pregnancy loss.  Stillborn baby. FINDING A GENETIC COUNSELOR Most caregivers know and have a list of genetic counselors. Your caregiver can refer you to one of them. You can also contact the [employer] for their locations and for more information.  AFTER RECEIVING GENETIC COUNSELING Genetic counselors can help you understand what options you have. They can help you understand what to do next. They can share the experiences of other couples who had babies with genetic problems. If you are at high risk for having a baby with a severe or fatal genetic disorder, you have several options:  Have in vitro fertilization with healthy eggs that were fertilized in vitro.  Use donor sperm or eggs.  Adopt a baby. Genetic counselors can also help if you find out when you are pregnant that the baby has a severe or fatal genetic disorder. Some options are:  Help you prepare for a particular disorder by speaking to:  Specialists in caring for Down syndrome.  Surgeon for heart defects.  Social workers.  Support groups.  Fetal surgery is sometimes possible and helpful to treat some genetic defects.  Ending the pregnancy. Document Released: 10/13/2002 Document Revised: 10/15/2011 Document Reviewed: 09/03/2007 [facility] Patient Information 2015 [place], [place]. This information is not intended to replace advice given to you by your health care provider. Make sure you discuss any questions you have with your health care provider. Pregnancy and Smoking Smoking during pregnancy is unhealthy for you and your developing baby. The addictive drug nicotine, carbon monoxide, and many other poisons are inhaled from a cigarette and carried through your bloodstream to your baby. Cigarette smoke contains more than 2,500 chemicals. It is not known which of these are harmful to a developing baby. However, both nicotine and carbon monoxide play a role in causing health  problems in pregnancy. Smoking during pregnancy increases the risk of:  Birth defects in your baby, including heart defects.  Miscarriage and stillbirth.  Birth before 37 completed weeks of pregnancy (premature birth).  Pregnancy outside of the uterus (tubal pregnancy).  Attachment of the placenta over the opening of the uterus (placenta previa).  Detachment of the placenta before the baby's birth (placental abruption).  Breaking of the bag of waters before labor begins (premature rupture of membranes). HOW DOES SMOKING DURING PREGNANCY AFFECT MY BABY? Before Birth Smoking during pregnancy:  Decreases blood flow and oxygen to your baby.  Increases the heart rate of your baby.  Slows your baby's growth in the uterus (intrauterine growth retardation). After Birth Babies born to women who smoke during pregnancy are more likely to have a low birth weight. They are also at risk for:  Serious health problems, chronic or lifelong disabilities (cerebral palsy, mental retardation, learning problems), and death.  Sudden infant death syndrome (SIDS).  Lung and breathing problems. WHAT RESOURCES ARE AVAILABLE TO HELP ME STOP SMOKING?  Ask your health care provider for help to stop smoking. The following resources are available:  Counseling.  Psychological treatment.  Acupuncture.  Family intervention.  Hypnosis.  Nicotine supplements have not been studied enough to know if they are safe to use during pregnancy. They should only be considered when all other methods fail, and if used under the close supervision of your health care provider.  Telephone QUIT lines. The national smoking cessation telephone hotline number is 1-800-QUIT NOW. FOR MORE INFORMATION  American Cancer Society: www.cancer.org  American Heart Association: www.heart.org  National Cancer Institute: www.cancer.gov  March of Dimes: www.marchofdimes.org Document Released: 12/04/2004 Document Revised:  07/28/2013 Document Reviewed: 06/22/2013 [facility] Patient Information 2015 [place], [place]. This information is not intended to replace advice given to you by your health care provider. Make sure you discuss any questions you have with your health care provider.

## 2015-04-26 NOTE — Progress Notes (Signed)
NEW OB HISTORY AND PHYSICAL  SUBJECTIVE:       Marilyn Gonzalez is a 24 y.o. G45P1001 female, Patient's last menstrual period was 01/05/2015 (lmp unknown)., Estimated Date of Delivery: 11/03/15, [redacted]w[redacted]d, presents today for establishment of Prenatal Care. She has no unusual complaints and complains of nothing but feeling stressed- unemployed, lives with daughter, h/o drug use and incarceration, is being treated in suboxone clinic      Gynecologic History Patient's last menstrual period was 01/05/2015 (lmp unknown). Unknown Contraception: none Last Pap: 2015. Results were: normal  Obstetric History OB History  Gravida Para Term Preterm AB SAB TAB Ectopic Multiple Living  # Outcome Date GA Lbr Len/2nd Weight Sex Delivery Anes PTL Lv  2 Current           1 Term 2013 [redacted]w[redacted]d  6 lb 13 oz (3.09 kg) F Vag-Spont EPI  Y      Past Medical History  Diagnosis Date  . Anxiety     Past Surgical History  Procedure Laterality Date  . Tonsillectomy      Current Outpatient Prescriptions on File Prior to Visit  Medication Sig Dispense Refill  . alprazolam (XANAX) 2 MG tablet Take 1 tablet (2 mg total) by mouth 2 (two) times daily. (Patient not taking: Reported on 03/03/2015) 30 tablet 0  . ciprofloxacin (CIPRO) 500 MG tablet Take 1 tablet (500 mg total) by mouth 2 (two) times daily. (Patient not taking: Reported on 04/26/2015) 14 tablet 0  . FLUoxetine (PROZAC) 20 MG capsule Take 1 capsule (20 mg total) by mouth daily. (Patient not taking: Reported on 03/03/2015) 30 capsule 0  . QUEtiapine (SEROQUEL) 50 MG tablet Take 1 tablet (50 mg total) by mouth at bedtime. (Patient not taking: Reported on 03/03/2015) 30 tablet 0   No current facility-administered medications on file prior to visit.    No Known Allergies  Social History   Social History  . Marital Status: Single    Spouse Name: N/A  . Number of Children: N/A  . Years of Education: N/A   Occupational History  . Not on  file.   Social History Main Topics  . Smoking status: Current Every Day Smoker  . Smokeless tobacco: Never Used  . Alcohol Use: Yes  . Drug Use: No  . Sexual Activity: Yes    Birth Control/ Protection: None     Comment: Pregnant   Other Topics Concern  . Not on file   Social History Narrative    Family History  Problem Relation Age of Onset  . Heart disease Maternal Grandmother   . Diabetes Maternal Grandmother   . Heart disease Paternal Grandfather     The following portions of the patient's history were reviewed and updated as appropriate: allergies, current medications, past OB history, past medical history, past surgical history, past family history, past social history, and problem list.    OBJECTIVE: Initial Physical Exam (New OB)  GENERAL APPEARANCE: alert, well appearing, in no apparent distress, oriented to person, place and time HEAD: normocephalic, atraumatic MOUTH: mucous membranes moist, pharynx normal without lesions and dental hygiene poor THYROID: no thyromegaly or masses present BREASTS: no masses noted, no significant tenderness, no palpable axillary nodes, no skin changes LUNGS: clear to auscultation, no wheezes, rales or rhonchi, symmetric air entry HEART: regular rate and rhythm, no murmurs ABDOMEN: soft, nontender, nondistended, no abnormal masses, no epigastric pain and FHT present EXTREMITIES: no  redness or tenderness in the calves or thighs SKIN: normal coloration and turgor, no rashes LYMPH NODES: no adenopathy palpable NEUROLOGIC: alert, oriented, normal speech, no focal findings or movement disorder noted  PELVIC EXAM EXTERNAL GENITALIA: normal appearing vulva with no masses, tenderness or lesions UTERUS: gravid and consistent with 13 weeks  ASSESSMENT: Normal pregnancy Suboxone use Smoker- counseled to quit   PLAN: Prenatal care See orders

## 2015-04-26 NOTE — Progress Notes (Signed)
ROB- pt c/o "foul odor" she thinks its coming from her urine, denies any other complaints

## 2015-04-27 LAB — CBC WITH DIFFERENTIAL/PLATELET
BASOS ABS: 0 10*3/uL (ref 0.0–0.2)
Basos: 0 %
EOS (ABSOLUTE): 0.2 10*3/uL (ref 0.0–0.4)
Eos: 2 %
Hematocrit: 39.1 % (ref 34.0–46.6)
Hemoglobin: 13 g/dL (ref 11.1–15.9)
IMMATURE GRANS (ABS): 0 10*3/uL (ref 0.0–0.1)
IMMATURE GRANULOCYTES: 0 %
LYMPHS: 19 %
Lymphocytes Absolute: 1.8 10*3/uL (ref 0.7–3.1)
MCH: 28.8 pg (ref 26.6–33.0)
MCHC: 33.2 g/dL (ref 31.5–35.7)
MCV: 87 fL (ref 79–97)
MONOCYTES: 6 %
Monocytes Absolute: 0.6 10*3/uL (ref 0.1–0.9)
NEUTROS PCT: 73 %
Neutrophils Absolute: 7.1 10*3/uL — ABNORMAL HIGH (ref 1.4–7.0)
PLATELETS: 243 10*3/uL (ref 150–379)
RBC: 4.52 x10E6/uL (ref 3.77–5.28)
RDW: 13.6 % (ref 12.3–15.4)
WBC: 9.7 10*3/uL (ref 3.4–10.8)

## 2015-04-27 LAB — GC/CHLAMYDIA PROBE AMP
Chlamydia trachomatis, NAA: NEGATIVE
Neisseria gonorrhoeae by PCR: NEGATIVE

## 2015-04-27 LAB — HEP, RPR, HIV PANEL
HEP B S AG: NEGATIVE
HIV SCREEN 4TH GENERATION: NONREACTIVE
RPR: NONREACTIVE

## 2015-04-27 LAB — VARICELLA ZOSTER ANTIBODY, IGG: VARICELLA: 1171 {index} (ref >165)

## 2015-04-27 LAB — ABO AND RH: Rh Factor: POSITIVE

## 2015-04-27 LAB — ANTIBODY SCREEN: Antibody Screen: NEGATIVE

## 2015-04-28 LAB — RUBELLA SCREEN: RUBELLA: 4.38 {index} (ref >0.99)

## 2015-04-28 LAB — URINE CULTURE: Organism ID, Bacteria: NO GROWTH

## 2015-05-03 ENCOUNTER — Other Ambulatory Visit: Payer: Self-pay | Admitting: Obstetrics and Gynecology

## 2015-05-03 ENCOUNTER — Encounter: Payer: Self-pay | Admitting: Obstetrics and Gynecology

## 2015-05-06 ENCOUNTER — Encounter: Payer: Self-pay | Admitting: Obstetrics and Gynecology

## 2015-05-11 ENCOUNTER — Encounter: Payer: Self-pay | Admitting: Obstetrics and Gynecology

## 2015-05-18 ENCOUNTER — Telehealth: Payer: Self-pay | Admitting: *Deleted

## 2015-05-18 NOTE — Telephone Encounter (Signed)
Pt finally called back and got her results

## 2015-05-18 NOTE — Telephone Encounter (Signed)
-----   Message from Ulyses AmorMelody N Burr, [place] sent at 05/03/2015  2:03 PM EDT ----- Please let her know panarama results- low risk female- I left her a message to call back

## 2015-05-24 ENCOUNTER — Encounter: Payer: Medicaid Other | Admitting: Obstetrics and Gynecology

## 2015-05-25 ENCOUNTER — Encounter: Payer: Self-pay | Admitting: Obstetrics and Gynecology

## 2015-05-25 ENCOUNTER — Ambulatory Visit (INDEPENDENT_AMBULATORY_CARE_PROVIDER_SITE_OTHER): Payer: Medicaid Other | Admitting: Obstetrics and Gynecology

## 2015-05-25 VITALS — BP 101/60 | HR 99 | Wt 131.0 lb

## 2015-05-25 DIAGNOSIS — Z3492 Encounter for supervision of normal pregnancy, unspecified, second trimester: Secondary | ICD-10-CM

## 2015-05-25 LAB — POCT URINALYSIS DIPSTICK
BILIRUBIN UA: NEGATIVE
GLUCOSE UA: NEGATIVE
KETONES UA: NEGATIVE
NITRITE UA: NEGATIVE
Protein, UA: NEGATIVE
RBC UA: NEGATIVE
Spec Grav, UA: 1.01
Urobilinogen, UA: 0.2
pH, UA: 6

## 2015-05-25 NOTE — Progress Notes (Signed)
ROB- doing well, reviewed labs, anatomy scan next visit.

## 2015-05-25 NOTE — Progress Notes (Signed)
ROB-pt denies any changes, states she is doing well

## 2015-06-15 ENCOUNTER — Encounter: Payer: Self-pay | Admitting: Obstetrics and Gynecology

## 2015-06-15 ENCOUNTER — Ambulatory Visit (INDEPENDENT_AMBULATORY_CARE_PROVIDER_SITE_OTHER): Payer: Medicaid Other | Admitting: Obstetrics and Gynecology

## 2015-06-15 ENCOUNTER — Ambulatory Visit: Payer: Medicaid Other

## 2015-06-15 VITALS — BP 98/55 | HR 82 | Wt 135.0 lb

## 2015-06-15 DIAGNOSIS — Z3492 Encounter for supervision of normal pregnancy, unspecified, second trimester: Secondary | ICD-10-CM

## 2015-06-15 LAB — POCT URINALYSIS DIPSTICK
BILIRUBIN UA: NEGATIVE
Glucose, UA: NEGATIVE
KETONES UA: NEGATIVE
Leukocytes, UA: NEGATIVE
NITRITE UA: NEGATIVE
PH UA: 6
RBC UA: NEGATIVE
SPEC GRAV UA: 1.025
Urobilinogen, UA: 0.2

## 2015-06-15 NOTE — Progress Notes (Signed)
  Indications:  Anatomy Findings:  Singleton intrauterine pregnancy is visualized with FHR at 147 BPM. Biometrics give an (U/S) Gestational age of [redacted] weeks and 3 days, and an (U/S) EDD of 10/30/2015; this correlates with the clinically established EDD of 11/03/2015.  Fetal presentation is vertex, spine right lateral.  EFW: 343 grams ( 0 lbs. 12 oz. ). Placenta: Anterior, grade 0 and remote to cervix.  AFI: Adequate with MVP at 4.9 cm  Anatomic survey is complete and normal. Gender - Female.   Right Ovary measures 3.5 x 1.9 x 1.8  cm. It is normal in appearance. Left Ovary measures 2.7 x 1.5 x 2.0 cm. It is normal appearance. There is no evidence of a corpus luteal cyst. Survey of the adnexa demonstrates no adnexal masses. There is no free peritoneal fluid in the cul de sac.  Impression: 1. 20 week 3 day Viable Singleton Intrauterine pregnancy by U/S. 2. (U/S) EDD is consistent with Clinically established (LMP) EDD of 11/03/2015. 3. Normal Anatomy Scan

## 2015-06-15 NOTE — Progress Notes (Signed)
ROB- pt denies any new complaints 

## 2015-07-11 ENCOUNTER — Observation Stay
Admission: EM | Admit: 2015-07-11 | Discharge: 2015-07-11 | Discharge status: Against Medical Advice | Payer: Medicaid Other | Attending: Obstetrics and Gynecology | Admitting: Obstetrics and Gynecology

## 2015-07-11 ENCOUNTER — Encounter: Payer: Self-pay | Admitting: *Deleted

## 2015-07-11 DIAGNOSIS — Z3A27 27 weeks gestation of pregnancy: Secondary | ICD-10-CM | POA: Diagnosis not present

## 2015-07-11 DIAGNOSIS — O26892 Other specified pregnancy related conditions, second trimester: Principal | ICD-10-CM | POA: Insufficient documentation

## 2015-07-11 DIAGNOSIS — R102 Pelvic and perineal pain: Secondary | ICD-10-CM | POA: Insufficient documentation

## 2015-07-11 LAB — URINALYSIS COMPLETE WITH MICROSCOPIC (ARMC ONLY)
BACTERIA UA: NONE SEEN
Bilirubin Urine: NEGATIVE
GLUCOSE, UA: NEGATIVE mg/dL
HGB URINE DIPSTICK: NEGATIVE
Ketones, ur: NEGATIVE mg/dL
NITRITE: NEGATIVE
PH: 6 (ref 5.0–8.0)
Protein, ur: 30 mg/dL — AB
SPECIFIC GRAVITY, URINE: 1.027 (ref 1.005–1.030)

## 2015-07-11 NOTE — Progress Notes (Signed)
Pt. States, "I have to leave, my husband has to go to work."  Spouse at the bedside. Pt. asked if we could call her with the results of the U/A. (Still pending). [phone], [phone], [phone] (house phone). Informed pt. that UTI could cause preterm labor and it is very important that she follows up (call the office) with Melody tomorrow during office hours.  Pt. States she has an appt. on 07/13/2015.  AMA form signed (leave against medical advice).

## 2015-07-11 NOTE — OB Triage Note (Signed)
Pt. Started feeling vaginal pressure on yesterday in the a.m.; pressure got worse today. Here to be evaluated. No vaginal discharge noted. Membrane remains intact.

## 2015-07-12 ENCOUNTER — Other Ambulatory Visit: Payer: Self-pay | Admitting: *Deleted

## 2015-07-12 MED ORDER — NITROFURANTOIN MONOHYD MACRO 100 MG PO CAPS: 100.0000 mg | ORAL_CAPSULE | Freq: Two times a day (BID) | ORAL | Status: DC

## 2015-07-13 ENCOUNTER — Encounter: Payer: Self-pay | Admitting: Obstetrics and Gynecology

## 2015-07-13 ENCOUNTER — Ambulatory Visit (INDEPENDENT_AMBULATORY_CARE_PROVIDER_SITE_OTHER): Payer: Medicaid Other | Admitting: Obstetrics and Gynecology

## 2015-07-13 VITALS — BP 98/68 | HR 88 | Wt 135.0 lb

## 2015-07-13 DIAGNOSIS — Z3492 Encounter for supervision of normal pregnancy, unspecified, second trimester: Secondary | ICD-10-CM

## 2015-07-13 LAB — POCT URINALYSIS DIPSTICK
BILIRUBIN UA: NEGATIVE
GLUCOSE UA: NEGATIVE
KETONES UA: NEGATIVE
Nitrite, UA: NEGATIVE
RBC UA: NEGATIVE
SPEC GRAV UA: 1.015
Urobilinogen, UA: 0.2
pH, UA: 6.5

## 2015-07-13 NOTE — Progress Notes (Signed)
ROB- denies any new complaints 

## 2015-07-13 NOTE — Progress Notes (Signed)
ROB- hasn't picked up antibiotic- will get today, glucola next visit.

## 2015-08-07 NOTE — L&D Delivery Note (Cosign Needed)
Delivery Note At  a viable and healthy female was delivered via  (PresentationROA: ;  ).  APGAR:8 ,9 ; weight  .   Placenta status:delivered intact with 3 vessel  Cord:  with the following complications:short cord .    Anesthesia:  epidural Episiotomy:  none Lacerations:  none Suture Repair: NA Est. Blood Loss (mL):  200  Mom to postpartum.  Baby to Couplet care / Skin to Skin.  Dinora Hemm [employer] Taralyn Ferraiolo, CNM 10/31/2015, 11:33 PM

## 2015-08-09 ENCOUNTER — Other Ambulatory Visit: Payer: Self-pay | Admitting: *Deleted

## 2015-08-09 DIAGNOSIS — Z331 Pregnant state, incidental: Secondary | ICD-10-CM

## 2015-08-09 DIAGNOSIS — Z131 Encounter for screening for diabetes mellitus: Secondary | ICD-10-CM

## 2015-08-10 ENCOUNTER — Ambulatory Visit (INDEPENDENT_AMBULATORY_CARE_PROVIDER_SITE_OTHER): Payer: Medicaid Other

## 2015-08-10 ENCOUNTER — Other Ambulatory Visit: Payer: Self-pay

## 2015-08-10 ENCOUNTER — Encounter: Payer: Self-pay | Admitting: Obstetrics and Gynecology

## 2015-08-10 ENCOUNTER — Ambulatory Visit (INDEPENDENT_AMBULATORY_CARE_PROVIDER_SITE_OTHER): Payer: Medicaid Other | Admitting: Obstetrics and Gynecology

## 2015-08-10 VITALS — BP 96/59 | HR 82 | Wt 135.5 lb

## 2015-08-10 DIAGNOSIS — Z331 Pregnant state, incidental: Secondary | ICD-10-CM

## 2015-08-10 DIAGNOSIS — Z23 Encounter for immunization: Secondary | ICD-10-CM | POA: Diagnosis not present

## 2015-08-10 DIAGNOSIS — Z131 Encounter for screening for diabetes mellitus: Secondary | ICD-10-CM

## 2015-08-10 MED ORDER — TETANUS-DIPHTH-ACELL PERTUSSIS 5-2.5-18.5 LF-MCG/0.5 IM SUSP
0.5000 mL | Freq: Once | INTRAMUSCULAR | Status: AC
  Administered 2015-08-10: 0.5 mL via INTRAMUSCULAR

## 2015-08-10 NOTE — Progress Notes (Signed)
ROB-glucola done,blood consent signed, tdap given 

## 2015-08-10 NOTE — Progress Notes (Signed)
ROB & glucola- doing well without complaint, still smoking- states does better some days than others, encouraged quitting, urine resent for drug screen.  ultrasound today for growth discrepency reveals:   Indications: Growth for Size<Dates Findings:  Singleton intrauterine pregnancy is visualized with FHR at 144 BPM. Biometrics give an (U/S) Gestational age of [redacted] weeks 1 day, and an (U/S) EDD of 11/01/15; this correlates with the clinically established EDD of 11/03/15.  Fetal presentation is vertex, spine left lateral.  EFW: 1210 grams ( 2 lbs. 11 oz.)  54th percentile for growth Placenta: Anterior, grade 1.  AFI: Adequate at 11.2 cm.   Anatomic survey of kidneys, 4 chamber heart, spine, stomach, bladder and lateral ventricle appear WNL.    Impression: 1. 28 week 1 day Viable Singleton Intrauterine pregnancy by U/S. 2. (U/S) EDD is consistent with Clinically established (LMP) EDD of 11/03/15. 3. Appropriate growth and AFI with growth at the 54th percentile.

## 2015-08-11 ENCOUNTER — Other Ambulatory Visit: Payer: Self-pay | Admitting: *Deleted

## 2015-08-11 DIAGNOSIS — Z331 Pregnant state, incidental: Secondary | ICD-10-CM

## 2015-08-11 LAB — HEMOGLOBIN AND HEMATOCRIT, BLOOD
HEMATOCRIT: 35.9 % (ref 34.0–46.6)
HEMOGLOBIN: 12 g/dL (ref 11.1–15.9)

## 2015-08-11 LAB — GLUCOSE, 1 HOUR GESTATIONAL: GESTATIONAL DIABETES SCREEN: 68 mg/dL (ref 65–139)

## 2015-08-12 LAB — PAIN MGT SCRN (14 DRUGS), UR
AMPHETAMINE SCRN UR: NEGATIVE ng/mL
BARBITURATE SCRN UR: NEGATIVE ng/mL
BENZODIAZEPINE SCREEN, URINE: NEGATIVE ng/mL
Buprenorphine, Urine: POSITIVE ng/mL
CANNABINOIDS UR QL SCN: NEGATIVE ng/mL
COCAINE(METAB.) SCREEN, URINE: NEGATIVE ng/mL
Creatinine(Crt), U: 49.5 mg/dL (ref 20.0–300.0)
Fentanyl, Urine: NEGATIVE pg/mL
Meperidine Screen, Urine: NEGATIVE ng/mL
Methadone Scn, Ur: NEGATIVE ng/mL
Opiate Scrn, Ur: NEGATIVE ng/mL
Oxycodone+Oxymorphone Ur Ql Scn: NEGATIVE ng/mL
PCP SCRN UR: NEGATIVE ng/mL
PH UR, DRUG SCRN: 8.6 (ref 4.5–8.9)
PROPOXYPHENE SCREEN: NEGATIVE ng/mL
TRAMADOL UR QL SCN: NEGATIVE ng/mL

## 2015-08-24 ENCOUNTER — Encounter: Payer: Medicaid Other | Admitting: Obstetrics and Gynecology

## 2015-08-26 ENCOUNTER — Encounter: Payer: Medicaid Other | Admitting: Obstetrics and Gynecology

## 2015-08-30 ENCOUNTER — Ambulatory Visit (INDEPENDENT_AMBULATORY_CARE_PROVIDER_SITE_OTHER): Payer: Medicaid Other | Admitting: Obstetrics and Gynecology

## 2015-08-30 ENCOUNTER — Telehealth: Payer: Self-pay | Admitting: *Deleted

## 2015-08-30 ENCOUNTER — Encounter: Payer: Self-pay | Admitting: Obstetrics and Gynecology

## 2015-08-30 VITALS — BP 92/62 | HR 118 | Wt 137.1 lb

## 2015-08-30 DIAGNOSIS — Z331 Pregnant state, incidental: Secondary | ICD-10-CM

## 2015-08-30 LAB — POCT URINALYSIS DIPSTICK
GLUCOSE UA: NEGATIVE
Ketones, UA: NEGATIVE
NITRITE UA: NEGATIVE
PH UA: 6.5
RBC UA: NEGATIVE
SPEC GRAV UA: 1.015
Urobilinogen, UA: 0.2

## 2015-08-30 NOTE — Telephone Encounter (Signed)
Notified pt of results 

## 2015-08-30 NOTE — Progress Notes (Signed)
ROB- doing well, FOB incarcerated, but supportive; considering Depo PP

## 2015-08-30 NOTE — Progress Notes (Signed)
ROB-pt is feeling good, denies any new complaints 

## 2015-08-30 NOTE — Telephone Encounter (Signed)
-----   Message from Purcell Nails, [place] sent at 08/11/2015  5:05 PM EST ----- Please let her know she passed her glucola and no signs of anemia

## 2015-09-13 ENCOUNTER — Encounter: Payer: Medicaid Other | Admitting: Obstetrics and Gynecology

## 2015-09-15 ENCOUNTER — Encounter: Payer: Medicaid Other | Admitting: Obstetrics and Gynecology

## 2015-09-20 ENCOUNTER — Ambulatory Visit (INDEPENDENT_AMBULATORY_CARE_PROVIDER_SITE_OTHER): Payer: Medicaid Other | Admitting: Obstetrics and Gynecology

## 2015-09-20 ENCOUNTER — Encounter: Payer: Self-pay | Admitting: Obstetrics and Gynecology

## 2015-09-20 VITALS — BP 95/65 | HR 91 | Wt 140.2 lb

## 2015-09-20 DIAGNOSIS — Z331 Pregnant state, incidental: Secondary | ICD-10-CM

## 2015-09-20 LAB — POCT URINALYSIS DIPSTICK
Bilirubin, UA: NEGATIVE
GLUCOSE UA: NEGATIVE
KETONES UA: NEGATIVE
Nitrite, UA: NEGATIVE
RBC UA: NEGATIVE
SPEC GRAV UA: 1.02
UROBILINOGEN UA: 0.2
pH, UA: 6.5

## 2015-09-20 NOTE — Progress Notes (Signed)
ROB- denies concerns, states car was in shop and grandmother has been sick in hospital, but all that is straightened out. Offer taxi vouchers- will let me know if they are needed.

## 2015-09-20 NOTE — Progress Notes (Signed)
ROB-pt denies any complaints 

## 2015-10-06 ENCOUNTER — Other Ambulatory Visit: Payer: Self-pay | Admitting: *Deleted

## 2015-10-06 ENCOUNTER — Encounter: Payer: Medicaid Other | Admitting: Obstetrics and Gynecology

## 2015-10-06 DIAGNOSIS — Z113 Encounter for screening for infections with a predominantly sexual mode of transmission: Secondary | ICD-10-CM

## 2015-10-06 DIAGNOSIS — Z3685 Encounter for antenatal screening for Streptococcus B: Secondary | ICD-10-CM

## 2015-10-06 DIAGNOSIS — Z331 Pregnant state, incidental: Secondary | ICD-10-CM

## 2015-10-12 ENCOUNTER — Encounter: Payer: Self-pay | Admitting: Obstetrics and Gynecology

## 2015-10-12 ENCOUNTER — Ambulatory Visit (INDEPENDENT_AMBULATORY_CARE_PROVIDER_SITE_OTHER): Payer: Medicaid Other | Admitting: Obstetrics and Gynecology

## 2015-10-12 ENCOUNTER — Other Ambulatory Visit: Payer: Self-pay | Admitting: Obstetrics and Gynecology

## 2015-10-12 ENCOUNTER — Encounter: Payer: Medicaid Other | Admitting: Obstetrics and Gynecology

## 2015-10-12 VITALS — BP 101/70 | HR 123 | Wt 138.1 lb

## 2015-10-12 DIAGNOSIS — Z331 Pregnant state, incidental: Secondary | ICD-10-CM

## 2015-10-12 LAB — POCT URINALYSIS DIPSTICK
BILIRUBIN UA: NEGATIVE
GLUCOSE UA: NEGATIVE
KETONES UA: NEGATIVE
Leukocytes, UA: NEGATIVE
NITRITE UA: NEGATIVE
RBC UA: NEGATIVE
SPEC GRAV UA: 1.01
Urobilinogen, UA: 0.2
pH, UA: 6.5

## 2015-10-12 NOTE — Progress Notes (Signed)
ROB- cultures obtained and labor precautions discussed. Desires epidural for labor

## 2015-10-12 NOTE — Progress Notes (Signed)
ROB- cultures obtained, pt denies any complaints 

## 2015-10-16 LAB — STREP GP B NAA: STREP GROUP B AG: NEGATIVE

## 2015-10-17 LAB — GC/CHLAMYDIA PROBE AMP
CHLAMYDIA, DNA PROBE: NEGATIVE
NEISSERIA GONORRHOEAE BY PCR: NEGATIVE

## 2015-10-19 ENCOUNTER — Ambulatory Visit (INDEPENDENT_AMBULATORY_CARE_PROVIDER_SITE_OTHER): Payer: Medicaid Other | Admitting: Obstetrics and Gynecology

## 2015-10-19 ENCOUNTER — Encounter: Payer: Self-pay | Admitting: Obstetrics and Gynecology

## 2015-10-19 VITALS — BP 88/66 | HR 91 | Wt 138.1 lb

## 2015-10-19 DIAGNOSIS — Z331 Pregnant state, incidental: Secondary | ICD-10-CM

## 2015-10-19 LAB — POCT URINALYSIS DIPSTICK
GLUCOSE UA: NEGATIVE
Ketones, UA: NEGATIVE
NITRITE UA: NEGATIVE
RBC UA: NEGATIVE
Spec Grav, UA: 1.01
Urobilinogen, UA: 0.2
pH, UA: 7

## 2015-10-19 NOTE — Progress Notes (Signed)
ROB-doing good, reviewed negative cultures.

## 2015-10-19 NOTE — Progress Notes (Signed)
ROB- pt denies any new complaints 

## 2015-10-27 ENCOUNTER — Encounter: Payer: Self-pay | Admitting: Obstetrics and Gynecology

## 2015-10-27 ENCOUNTER — Ambulatory Visit (INDEPENDENT_AMBULATORY_CARE_PROVIDER_SITE_OTHER): Payer: Medicaid Other | Admitting: Obstetrics and Gynecology

## 2015-10-27 VITALS — BP 99/61 | HR 84 | Wt 136.0 lb

## 2015-10-27 DIAGNOSIS — Z331 Pregnant state, incidental: Secondary | ICD-10-CM

## 2015-10-27 LAB — POCT URINALYSIS DIPSTICK
BILIRUBIN UA: NEGATIVE
GLUCOSE UA: NEGATIVE
Ketones, UA: NEGATIVE
LEUKOCYTES UA: NEGATIVE
NITRITE UA: NEGATIVE
RBC UA: NEGATIVE
Spec Grav, UA: 1.01
UROBILINOGEN UA: 0.2
pH, UA: 6

## 2015-10-27 NOTE — Progress Notes (Signed)
ROB- pt is hurting, states she is having lots of pressure, cramping, has had some diarrhea today

## 2015-10-27 NOTE — Progress Notes (Signed)
ROB- labor precautions discussed 

## 2015-10-31 ENCOUNTER — Inpatient Hospital Stay: Payer: Medicaid Other | Admitting: Anesthesiology

## 2015-10-31 ENCOUNTER — Telehealth: Payer: Self-pay | Admitting: Obstetrics and Gynecology

## 2015-10-31 ENCOUNTER — Inpatient Hospital Stay
Admission: EM | Admit: 2015-10-31 | Discharge: 2015-11-02 | DRG: 775 | Disposition: A | Discharge status: Home / Self Care | Payer: Medicaid Other | Attending: Obstetrics and Gynecology | Admitting: Obstetrics and Gynecology

## 2015-10-31 DIAGNOSIS — Z8249 Family history of ischemic heart disease and other diseases of the circulatory system: Secondary | ICD-10-CM | POA: Diagnosis not present

## 2015-10-31 DIAGNOSIS — Z3A39 39 weeks gestation of pregnancy: Secondary | ICD-10-CM | POA: Diagnosis not present

## 2015-10-31 DIAGNOSIS — F17210 Nicotine dependence, cigarettes, uncomplicated: Secondary | ICD-10-CM | POA: Diagnosis present

## 2015-10-31 DIAGNOSIS — O99334 Smoking (tobacco) complicating childbirth: Secondary | ICD-10-CM | POA: Diagnosis present

## 2015-10-31 DIAGNOSIS — Z79899 Other long term (current) drug therapy: Secondary | ICD-10-CM

## 2015-10-31 DIAGNOSIS — Z833 Family history of diabetes mellitus: Secondary | ICD-10-CM | POA: Diagnosis not present

## 2015-10-31 DIAGNOSIS — Z3483 Encounter for supervision of other normal pregnancy, third trimester: Secondary | ICD-10-CM

## 2015-10-31 DIAGNOSIS — IMO0001 Reserved for inherently not codable concepts without codable children: Secondary | ICD-10-CM

## 2015-10-31 LAB — ABO/RH: ABO/RH(D): A POS

## 2015-10-31 LAB — TYPE AND SCREEN
ABO/RH(D): A POS
Antibody Screen: NEGATIVE

## 2015-10-31 LAB — RAPID HIV SCREEN (HIV 1/2 AB+AG)
HIV 1/2 ANTIBODIES: NONREACTIVE
HIV-1 P24 ANTIGEN - HIV24: NONREACTIVE

## 2015-10-31 LAB — URINE DRUG SCREEN, QUALITATIVE (ARMC ONLY)
AMPHETAMINES, UR SCREEN: NOT DETECTED
Barbiturates, Ur Screen: NOT DETECTED
Benzodiazepine, Ur Scrn: NOT DETECTED
Cannabinoid 50 Ng, Ur ~~LOC~~: NOT DETECTED
Cocaine Metabolite,Ur ~~LOC~~: NOT DETECTED
MDMA (ECSTASY) UR SCREEN: NOT DETECTED
Methadone Scn, Ur: NOT DETECTED
Opiate, Ur Screen: NOT DETECTED
Phencyclidine (PCP) Ur S: NOT DETECTED
TRICYCLIC, UR SCREEN: NOT DETECTED

## 2015-10-31 LAB — CBC
HEMATOCRIT: 36.4 % (ref 35.0–47.0)
Hemoglobin: 12.4 g/dL (ref 12.0–16.0)
MCH: 29.1 pg (ref 26.0–34.0)
MCHC: 34.1 g/dL (ref 32.0–36.0)
MCV: 85.4 fL (ref 80.0–100.0)
Platelets: 156 10*3/uL (ref 150–440)
RBC: 4.26 MIL/uL (ref 3.80–5.20)
RDW: 13.4 % (ref 11.5–14.5)
WBC: 9.5 10*3/uL (ref 3.6–11.0)

## 2015-10-31 LAB — CHLAMYDIA/NGC RT PCR (ARMC ONLY)
CHLAMYDIA TR: NOT DETECTED
N gonorrhoeae: NOT DETECTED

## 2015-10-31 MED ORDER — PHENYLEPHRINE 40 MCG/ML (10ML) SYRINGE FOR IV PUSH (FOR BLOOD PRESSURE SUPPORT)
80.0000 ug | PREFILLED_SYRINGE | INTRAVENOUS | Status: DC | PRN
  Filled 2015-10-31: qty 2

## 2015-10-31 MED ORDER — FENTANYL CITRATE (PF) 100 MCG/2ML IJ SOLN
50.0000 ug | INTRAMUSCULAR | Status: DC | PRN
  Administered 2015-10-31: 50 ug via INTRAVENOUS
  Filled 2015-10-31: qty 2

## 2015-10-31 MED ORDER — EPHEDRINE 5 MG/ML INJ
10.0000 mg | INTRAVENOUS | Status: DC | PRN
  Filled 2015-10-31: qty 2

## 2015-10-31 MED ORDER — LIDOCAINE HCL (PF) 1 % IJ SOLN
INTRAMUSCULAR | Status: AC
  Filled 2015-10-31: qty 30

## 2015-10-31 MED ORDER — AMMONIA AROMATIC IN INHA
RESPIRATORY_TRACT | Status: AC
  Filled 2015-10-31: qty 10

## 2015-10-31 MED ORDER — MISOPROSTOL 200 MCG PO TABS
ORAL_TABLET | ORAL | Status: AC
  Filled 2015-10-31: qty 4

## 2015-10-31 MED ORDER — LIDOCAINE HCL (PF) 1 % IJ SOLN: 30.0000 mL | INTRAMUSCULAR | Status: DC | PRN

## 2015-10-31 MED ORDER — LACTATED RINGERS IV SOLN: 500.0000 mL | INTRAVENOUS | Status: DC | PRN

## 2015-10-31 MED ORDER — DIPHENHYDRAMINE HCL 50 MG/ML IJ SOLN: 12.5000 mg | INTRAMUSCULAR | Status: DC | PRN

## 2015-10-31 MED ORDER — LIDOCAINE-EPINEPHRINE (PF) 1.5 %-1:200000 IJ SOLN
INTRAMUSCULAR | Status: DC | PRN
  Administered 2015-10-31: 4 mL via EPIDURAL

## 2015-10-31 MED ORDER — ACETAMINOPHEN 325 MG PO TABS: 650.0000 mg | ORAL_TABLET | ORAL | Status: DC | PRN

## 2015-10-31 MED ORDER — FENTANYL 2.5 MCG/ML W/ROPIVACAINE 0.2% IN NS 100 ML EPIDURAL INFUSION (ARMC-ANES): 9.0000 mL/h | EPIDURAL | Status: DC

## 2015-10-31 MED ORDER — CITRIC ACID-SODIUM CITRATE 334-500 MG/5ML PO SOLN
30.0000 mL | ORAL | Status: DC | PRN
  Administered 2015-11-01: 30 mL via ORAL
  Filled 2015-10-31: qty 30

## 2015-10-31 MED ORDER — FENTANYL 2.5 MCG/ML W/ROPIVACAINE 0.2% IN NS 100 ML EPIDURAL INFUSION (ARMC-ANES)
EPIDURAL | Status: AC
  Administered 2015-10-31: 9 mL/h via EPIDURAL
  Filled 2015-10-31: qty 100

## 2015-10-31 MED ORDER — OXYTOCIN 40 UNITS IN LACTATED RINGERS INFUSION - SIMPLE MED
2.5000 [IU]/h | INTRAVENOUS | Status: DC
  Administered 2015-10-31: 2.5 [IU]/h via INTRAVENOUS
  Filled 2015-10-31: qty 1000

## 2015-10-31 MED ORDER — TERBUTALINE SULFATE 1 MG/ML IJ SOLN: 0.2500 mg | Freq: Once | INTRAMUSCULAR | Status: DC | PRN

## 2015-10-31 MED ORDER — ONDANSETRON HCL 4 MG/2ML IJ SOLN
4.0000 mg | Freq: Four times a day (QID) | INTRAMUSCULAR | Status: DC | PRN
  Administered 2015-11-01: 4 mg via INTRAVENOUS
  Filled 2015-10-31: qty 2

## 2015-10-31 MED ORDER — OXYTOCIN 40 UNITS IN LACTATED RINGERS INFUSION - SIMPLE MED
1.0000 m[IU]/min | INTRAVENOUS | Status: DC
  Administered 2015-10-31: 1 m[IU]/min via INTRAVENOUS

## 2015-10-31 MED ORDER — LIDOCAINE HCL (PF) 1 % IJ SOLN
INTRAMUSCULAR | Status: DC | PRN
  Administered 2015-10-31: 3 mL

## 2015-10-31 MED ORDER — BUPIVACAINE HCL (PF) 0.25 % IJ SOLN
INTRAMUSCULAR | Status: DC | PRN
  Administered 2015-10-31: 5 mL via EPIDURAL

## 2015-10-31 MED ORDER — LACTATED RINGERS IV SOLN: 500.0000 mL | Freq: Once | INTRAVENOUS | Status: DC

## 2015-10-31 MED ORDER — LACTATED RINGERS IV SOLN
INTRAVENOUS | Status: DC
  Administered 2015-10-31: 18:00:00 via INTRAVENOUS

## 2015-10-31 MED ORDER — OXYTOCIN BOLUS FROM INFUSION
500.0000 mL | INTRAVENOUS | Status: DC
Start: 2015-10-31 — End: 2015-11-01

## 2015-10-31 MED ORDER — OXYTOCIN 10 UNIT/ML IJ SOLN
INTRAMUSCULAR | Status: AC
  Filled 2015-10-31: qty 2

## 2015-10-31 NOTE — Anesthesia Procedure Notes (Signed)
Epidural Patient location during procedure: OB  Staffing Performed by: anesthesiologist   Preanesthetic Checklist Completed: patient identified, site marked, surgical consent, pre-op evaluation, timeout performed, IV checked, risks and benefits discussed and monitors and equipment checked  Epidural Patient position: sitting Prep: Betadine Patient monitoring: heart rate, continuous pulse ox and blood pressure Approach: midline Location: L4-L5 Injection technique: LOR air  Needle:  Needle type: Tuohy  Needle gauge: 18 G Needle length: 9 cm and 9 Needle insertion depth: 5 cm Catheter type: closed end flexible Catheter size: 20 Guage Catheter at skin depth: 11 cm Test dose: negative and 1.5% lidocaine with Epi 1:200 K  Assessment Sensory level: T10 Events: blood not aspirated, injection not painful, no injection resistance, negative IV test and no paresthesia  Additional Notes Pt's history reviewed and consent obtained as per OB consent Patient tolerated the insertion well without complications. Negative SATD, negative IVTD All VSS were obtained and monitored through OBIX and nursing protocols followed.Reason for block:procedure for pain   

## 2015-10-31 NOTE — Progress Notes (Signed)
Marilyn Gonzalez is a 25 y.o. G2P1001 at 5019w4d by LMP admitted for active labor  Subjective: Reports legs feeling 'wierd' and that she doesn't like it, but denies pain.  Objective: BP 91/41 mmHg  Pulse 76  Temp(Src) 97.5 F (36.4 C) (Oral)  SpO2 100%  LMP 01/05/2015 (LMP Unknown)      FHT:  FHR: 120 bpm, variability: minimal ,  accelerations:  Present,  decelerations:  Present early UC:   regular, every 2-3 minutes, on 11 mu/min of pitocin SVE:   Dilation: 8 Effacement (%): 70 Station: -2 Exam by:: Brown HumanM. Shambly, CNM, AROM withscant amount clear fluid with bloody show.  Labs: Lab Results  Component Value Date   WBC 9.5 10/31/2015   HGB 12.4 10/31/2015   HCT 36.4 10/31/2015   MCV 85.4 10/31/2015   PLT 156 10/31/2015    Assessment / Plan: Augmentation of labor, progressing well  Labor: Progressing on Pitocin, will continue to increase then AROM Preeclampsia:  labs stable Fetal Wellbeing:  Category II Pain Control:  Epidural I/D:  n/a Anticipated MOD:  NSVD  Marilyn Gonzalez , CNM  10/31/2015, 10:31 PM

## 2015-10-31 NOTE — Anesthesia Preprocedure Evaluation (Signed)
Anesthesia Evaluation  Patient identified by MRN, date of birth, ID band Patient awake    Reviewed: Allergy & Precautions, H&P , NPO status , Patient's Chart, lab work & pertinent test results, reviewed documented beta blocker date and time   Airway Mallampati: II  TM Distance: >3 FB Neck ROM: full    Dental no notable dental hx. (+) Teeth Intact   Pulmonary neg pulmonary ROS, Current Smoker,    Pulmonary exam normal breath sounds clear to auscultation       Cardiovascular Exercise Tolerance: Good negative cardio ROS Normal cardiovascular exam Rhythm:regular Rate:Normal     Neuro/Psych PSYCHIATRIC DISORDERS negative neurological ROS  negative psych ROS   GI/Hepatic negative GI ROS, Neg liver ROS,   Endo/Other  negative endocrine ROS  Renal/GU negative Renal ROS  negative genitourinary   Musculoskeletal   Abdominal   Peds  Hematology negative hematology ROS (+)   Anesthesia Other Findings   Reproductive/Obstetrics (+) Pregnancy                             Anesthesia Physical Anesthesia Plan  ASA: II  Anesthesia Plan: Regional and Epidural   Post-op Pain Management:    Induction:   Airway Management Planned:   Additional Equipment:   Intra-op Plan:   Post-operative Plan:   Informed Consent: I have reviewed the patients History and Physical, chart, labs and discussed the procedure including the risks, benefits and alternatives for the proposed anesthesia with the patient or authorized representative who has indicated his/her understanding and acceptance.     Plan Discussed with: CRNA  Anesthesia Plan Comments:         Anesthesia Quick Evaluation

## 2015-10-31 NOTE — OB Triage Note (Signed)
Patient presented to L&D complaining of leaking a stream of clear fluid around 12:30.  States contractions started after she started leaking fluid. States contractions are every 3-5 minutes. Denies vaginal bleeding or decreased fetal movement.

## 2015-10-31 NOTE — Progress Notes (Signed)
Marilyn SalaamChristy D Gonzalez is a 25 y.o. G2P1001 at 2872w4d by LMP admitted for active labor  Subjective: Reports pain with contractions, request pain meds  Objective: BP 114/70 mmHg  Pulse 100  LMP 01/05/2015 (LMP Unknown)      FHT:  FHR: 125 bpm, variability: moderate,  accelerations:  Present,  decelerations:  Absent UC:   irregular, every 3-7 minutes SVE:   Dilation: 6 Effacement (%): 60 Station: -2 Exam by:: M.Shambley, CNM  Labs: Lab Results  Component Value Date   WBC 9.5 10/31/2015   HGB 12.4 10/31/2015   HCT 36.4 10/31/2015   MCV 85.4 10/31/2015   PLT 156 10/31/2015    Assessment / Plan: Spontaneous labor, progressing normally  Labor: slow progress Preeclampsia:  labs stable Fetal Wellbeing:  Category I Pain Control:  IV pain meds I/D:  n/a Anticipated MOD:  NSVD  Melody N Shambley,CNM 10/31/2015, 6:05 PM

## 2015-10-31 NOTE — Telephone Encounter (Signed)
ERROR

## 2015-10-31 NOTE — H&P (Signed)
Obstetric History and Physical  Marilyn Gonzalez Knauer is a 25 y.o. G2P1001 with IUP at 7747w4d presenting with leaking fluid and cramping. Patient states she has been having  irregular, every 2-6 minutes contractions, minimal vaginal bleeding, intact, negative nitrazine membranes, with active fetal movement.    Prenatal Course [facility] Care[facility]  Pregnancy complications or risks:smoker, suboxone use, partner is incarcerated  Prenatal labs and studies: ABO, Rh: A/Positive/-- (09/20 1515) Antibody: Negative (09/20 1515) Rubella: 4.38 (09/20 1515) RPR: Non Reactive (09/20 1515)  HBsAg: Negative (09/20 1515)  HIV: Non Reactive (09/20 1515)  ZOX:WRUEAVWUGBS:Negative (03/08 1000) 1 hr Glucola  normal Genetic screening normal Anatomy US normal  Past Medical History  Diagnosis Date  . Anxiety   . Mental disorder     Past Surgical History  Procedure Laterality Date  . Tonsillectomy      OB History  Gravida Para Term Preterm AB SAB TAB Ectopic Multiple Living  2 1 1       1     # Outcome Date GA Lbr Len/2nd Weight Sex Delivery Anes PTL Lv  2 Current           1 Term 2013 6683w0d  6 lb 13 oz (3.09 kg) F Vag-Spont EPI  Y      Social History   Social History  . Marital Status: Single    Spouse Name: N/A  . Number of Children: N/A  . Years of Education: N/A   Social History Main Topics  . Smoking status: Current Every Day Smoker    Types: Cigarettes  . Smokeless tobacco: Never Used  . Alcohol Use: No  . Drug Use: No  . Sexual Activity: Yes    Birth Control/ Protection: None     Comment: Pregnant   Other Topics Concern  . None   Social History Narrative    Family History  Problem Relation Age of Onset  . Heart disease Maternal Grandmother   . Diabetes Maternal Grandmother   . Heart disease Paternal Grandfather     Prescriptions prior to admission  Medication Sig Dispense Refill Last Dose  . buprenorphine-naloxone (SUBOXONE) 2-0.5 MG SUBL SL tablet Place 1 tablet under the  tongue daily. 180 tablet o Taking  . nitrofurantoin, macrocrystal-monohydrate, (MACROBID) 100 MG capsule Take 1 capsule (100 mg total) by mouth 2 (two) times daily. (Patient not taking: Reported on 07/13/2015) 14 capsule 0 Completed Course at Unknown time  . Prenatal Vit-Fe Fumarate-FA (PRENATAL MULTIVITAMIN) TABS tablet Take 1 tablet by mouth daily at 12 noon.   Taking    No Known Allergies  Review of Systems: Negative except for what is mentioned in HPI.  Physical Exam: LMP 01/05/2015 (LMP Unknown) GENERAL: Well-developed, well-nourished female in no acute distress.  LUNGS: Clear to auscultation bilaterally.  HEART: Regular rate and rhythm. ABDOMEN: Soft, nontender, nondistended, gravid. EXTREMITIES: Nontender, no edema, 2+ distal pulses. Cervical Exam: Dilation: 3.5 Effacement (%): 60 Cervical Position: Posterior Station: -2 Exam by:: M.Newton,RN FHT:  Baseline rate 120 bpm   Variability moderate  Accelerations absent   Decelerations none Contractions: Every 2-5 mins   Pertinent Labs/Studies:   No results found for this or any previous visit (from the past 24 hour(s)).  Assessment : Marilyn Gonzalez Klinck is a 25 y.o. G2P1001 at 6447w4d being admitted for labor.  Plan: Labor: Expectant management.  Induction/Augmentation as needed, per protocol FWB: Reassuring fetal heart tracing.  GBS negative, will make nursery aware of suboxone use Delivery plan: Hopeful for vaginal delivery  Casha Estupinan Aura Camps, CNM [facility]

## 2015-10-31 NOTE — Plan of Care (Signed)
Spoke with provider Melody DaltonShambley, CNM, notified of patient admission to unit and completed nursing assessment including pts uterine contraction pattern, FHR, SVE and nitrazine negative results for amniotic fluid. Orders given for patient to apply peri-pad and walk in hallways for one hour then recheck cervix.

## 2015-11-01 ENCOUNTER — Encounter: Payer: Self-pay | Admitting: *Deleted

## 2015-11-01 LAB — RPR: RPR Ser Ql: NONREACTIVE

## 2015-11-01 LAB — CBC
HCT: 37.7 % (ref 35.0–47.0)
Hemoglobin: 12.6 g/dL (ref 12.0–16.0)
MCH: 29.1 pg (ref 26.0–34.0)
MCHC: 33.4 g/dL (ref 32.0–36.0)
MCV: 86.9 fL (ref 80.0–100.0)
PLATELETS: 124 10*3/uL — AB (ref 150–440)
RBC: 4.34 MIL/uL (ref 3.80–5.20)
RDW: 13.4 % (ref 11.5–14.5)
WBC: 12.9 10*3/uL — AB (ref 3.6–11.0)

## 2015-11-01 MED ORDER — NICOTINE 14 MG/24HR TD PT24
14.0000 mg | MEDICATED_PATCH | Freq: Every day | TRANSDERMAL | Status: DC
  Filled 2015-11-01 (×2): qty 1

## 2015-11-01 MED ORDER — IBUPROFEN 600 MG PO TABS
600.0000 mg | ORAL_TABLET | Freq: Four times a day (QID) | ORAL | Status: DC
  Administered 2015-11-01 – 2015-11-02 (×5): 600 mg via ORAL
  Filled 2015-11-01 (×7): qty 1

## 2015-11-01 MED ORDER — ONDANSETRON HCL 4 MG/2ML IJ SOLN: 4.0000 mg | INTRAMUSCULAR | Status: DC | PRN

## 2015-11-01 MED ORDER — BENZOCAINE-MENTHOL 20-0.5 % EX AERO
1.0000 "application " | INHALATION_SPRAY | CUTANEOUS | Status: DC | PRN
  Filled 2015-11-01: qty 56

## 2015-11-01 MED ORDER — ONDANSETRON HCL 4 MG PO TABS: 4.0000 mg | ORAL_TABLET | ORAL | Status: DC | PRN

## 2015-11-01 MED ORDER — SENNOSIDES-DOCUSATE SODIUM 8.6-50 MG PO TABS
2.0000 | ORAL_TABLET | ORAL | Status: DC
  Administered 2015-11-01 – 2015-11-02 (×2): 2 via ORAL
  Filled 2015-11-01 (×2): qty 2

## 2015-11-01 MED ORDER — SIMETHICONE 80 MG PO CHEW: 80.0000 mg | CHEWABLE_TABLET | ORAL | Status: DC | PRN

## 2015-11-01 MED ORDER — DIBUCAINE 1 % RE OINT
1.0000 "application " | TOPICAL_OINTMENT | RECTAL | Status: DC | PRN
  Filled 2015-11-01: qty 28

## 2015-11-01 MED ORDER — BUPRENORPHINE HCL 8 MG SL SUBL
8.0000 mg | SUBLINGUAL_TABLET | Freq: Two times a day (BID) | SUBLINGUAL | Status: DC
  Administered 2015-11-01 – 2015-11-02 (×3): 8 mg via SUBLINGUAL
  Filled 2015-11-01 (×3): qty 1

## 2015-11-01 MED ORDER — WITCH HAZEL-GLYCERIN EX PADS: 1.0000 "application " | MEDICATED_PAD | CUTANEOUS | Status: DC | PRN

## 2015-11-01 MED ORDER — ACETAMINOPHEN 325 MG PO TABS: 650.0000 mg | ORAL_TABLET | ORAL | Status: DC | PRN

## 2015-11-01 MED ORDER — DIPHENHYDRAMINE HCL 25 MG PO CAPS: 25.0000 mg | ORAL_CAPSULE | Freq: Four times a day (QID) | ORAL | Status: DC | PRN

## 2015-11-01 MED ORDER — PRENATAL MULTIVITAMIN CH
1.0000 | ORAL_TABLET | Freq: Every day | ORAL | Status: DC
  Administered 2015-11-01 – 2015-11-02 (×2): 1 via ORAL
  Filled 2015-11-01 (×2): qty 1

## 2015-11-01 NOTE — Progress Notes (Signed)
Post Partum Day 1 Subjective: no complaints and voiding  Objective: Blood pressure 98/63, pulse 61, temperature 97.8 F (36.6 C), temperature source Oral, resp. rate 18, weight 136 lb (61.689 kg), last menstrual period 01/05/2015, SpO2 99 %, unknown if currently breastfeeding.  Physical Exam:  General: alert, cooperative and appears stated age Lochia: appropriate Uterine Fundus: firm Incision: NA DVT Evaluation: No evidence of DVT seen on physical exam. Negative Homan's sign.   Recent Labs  10/31/15 1714 11/01/15 0548  HGB 12.4 12.6  HCT 36.4 37.7    Assessment/Plan: Plan for discharge tomorrow and Social Work consult Infant feeding only Bottle;    LOS: 1 day   Sun MicrosystemsMelody N Marykatherine Sherwood, CNM 11/01/2015, 8:10 AM

## 2015-11-01 NOTE — Clinical Social Work Note (Signed)
Correction to previous note: patient and her daughter and newborn will be residing with patient's grandparents at discharge. York SpanielMonica Ahmir Bracken MSW,LCSW [phone]

## 2015-11-01 NOTE — Anesthesia Postprocedure Evaluation (Signed)
Anesthesia Post Note  Patient: Marilyn Gonzalez  Procedure(s) Performed: * No procedures listed *  Patient location during evaluation: Mother Baby Anesthesia Type: Epidural Level of consciousness: awake Pain management: pain level controlled Vital Signs Assessment: post-procedure vital signs reviewed and stable Respiratory status: spontaneous breathing, nonlabored ventilation and respiratory function stable Cardiovascular status: stable Postop Assessment: no headache and no backache Anesthetic complications: no    Last Vitals:  Filed Vitals:   11/01/15 0159 11/01/15 0249  BP: 102/70 98/63  Pulse: 71 61  Temp:  36.6 C  Resp:  18    Last Pain:  Filed Vitals:   11/01/15 0340  PainSc: 0-No pain                 Hasana Alcorta,  Delissa Silba R
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
## 2015-11-02 MED ORDER — MEDROXYPROGESTERONE ACETATE 150 MG/ML IM SUSP: 150.0000 mg | INTRAMUSCULAR | Status: DC

## 2015-11-02 NOTE — Progress Notes (Signed)
Discharge instructions provided.  Pt and sig other verbalize understanding of all instructions and follow-up care.  Prescription given.  Pt discharged to Pediatrics with infant at 1230. Reynold BowenSusan Paisley Erez Mccallum, RN 11/02/2015 4:22 PM

## 2015-11-02 NOTE — Progress Notes (Signed)
Pt states that she received TDaP and Influenza vaccines during pregnancy.  Pt declines vaccines at this time. Reynold BowenSusan Paisley Winnona Wargo, RN 11/02/2015 11:47 AM

## 2015-11-02 NOTE — Discharge Summary (Signed)
Obstetric Discharge Summary Reason for Admission: onset of labor Prenatal Procedures: ultrasound Intrapartum Procedures: spontaneous vaginal delivery Postpartum Procedures: none Complications-Operative and Postpartum: none HEMOGLOBIN  Date Value Ref Range Status  11/01/2015 12.6 12.0 - 16.0 g/dL Final   HGB  Date Value Ref Range Status  06/19/2012 13.0 12.0-16.0 g/dL Final   HCT  Date Value Ref Range Status  11/01/2015 37.7 35.0 - 47.0 % Final  06/20/2012 35.9 35.0-47.0 % Final   HEMATOCRIT  Date Value Ref Range Status  08/10/2015 35.9 34.0 - 46.6 % Final    Physical Exam:  General: alert, cooperative and appears stated age Lochia: appropriate Uterine Fundus: firm Incision: NA DVT Evaluation: No evidence of DVT seen on physical exam. Negative Homan's sign.  Discharge Diagnoses: Term Pregnancy-delivered  Discharge Information: Date: 11/02/2015 Activity: pelvic rest Diet: routine Medications: Ibuprofen Condition: stable Instructions: refer to practice specific booklet Discharge to: home   Newborn Data: Live born female "Serenity" Birth Weight: 6 lb 11.2 oz (3040 g) APGAR: 8, 9 Bottle feeding only  Infant to remain in [facility] for NAS observation for at least 5 days.  Liandro Thelin N Fard Borunda,CNM 11/02/2015, 8:11 AM

## 2015-11-02 NOTE — Discharge Instructions (Signed)
Call your doctor for increased pain or vaginal bleeding, temperature above 100.4, depression, or concerns.  No strenuous activity or heavy lifting for 6 weeks.  No intercourse, tampons, douching, or enemas for 6 weeks.  No tub baths-showers only.  No driving for 2 weeks or while taking pain medications.  Continue prenatal vitamin and iron.   °

## 2015-11-03 ENCOUNTER — Encounter: Payer: Medicaid Other | Admitting: Obstetrics and Gynecology

## 2015-12-16 ENCOUNTER — Encounter (HOSPITAL_COMMUNITY): Payer: Self-pay | Admitting: Emergency Medicine

## 2015-12-16 ENCOUNTER — Emergency Department (HOSPITAL_COMMUNITY)
Admission: EM | Admit: 2015-12-16 | Discharge: 2015-12-17 | Disposition: A | Discharge status: Home / Self Care | Payer: Medicaid Other | Attending: Emergency Medicine | Admitting: Emergency Medicine

## 2015-12-16 ENCOUNTER — Other Ambulatory Visit: Payer: Self-pay

## 2015-12-16 ENCOUNTER — Emergency Department (HOSPITAL_COMMUNITY): Payer: Medicaid Other

## 2015-12-16 DIAGNOSIS — S80811A Abrasion, right lower leg, initial encounter: Secondary | ICD-10-CM | POA: Insufficient documentation

## 2015-12-16 DIAGNOSIS — Y9389 Activity, other specified: Secondary | ICD-10-CM | POA: Insufficient documentation

## 2015-12-16 DIAGNOSIS — J982 Interstitial emphysema: Secondary | ICD-10-CM | POA: Insufficient documentation

## 2015-12-16 DIAGNOSIS — Z3202 Encounter for pregnancy test, result negative: Secondary | ICD-10-CM | POA: Diagnosis not present

## 2015-12-16 DIAGNOSIS — S27329A Contusion of lung, unspecified, initial encounter: Secondary | ICD-10-CM | POA: Diagnosis not present

## 2015-12-16 DIAGNOSIS — F17210 Nicotine dependence, cigarettes, uncomplicated: Secondary | ICD-10-CM | POA: Insufficient documentation

## 2015-12-16 DIAGNOSIS — S01511A Laceration without foreign body of lip, initial encounter: Secondary | ICD-10-CM | POA: Insufficient documentation

## 2015-12-16 DIAGNOSIS — S060X9A Concussion with loss of consciousness of unspecified duration, initial encounter: Secondary | ICD-10-CM | POA: Diagnosis not present

## 2015-12-16 DIAGNOSIS — S199XXA Unspecified injury of neck, initial encounter: Secondary | ICD-10-CM | POA: Diagnosis not present

## 2015-12-16 DIAGNOSIS — Y92410 Unspecified street and highway as the place of occurrence of the external cause: Secondary | ICD-10-CM | POA: Diagnosis not present

## 2015-12-16 DIAGNOSIS — S80812A Abrasion, left lower leg, initial encounter: Secondary | ICD-10-CM | POA: Insufficient documentation

## 2015-12-16 DIAGNOSIS — S81812A Laceration without foreign body, left lower leg, initial encounter: Secondary | ICD-10-CM | POA: Insufficient documentation

## 2015-12-16 DIAGNOSIS — S22089A Unspecified fracture of T11-T12 vertebra, initial encounter for closed fracture: Secondary | ICD-10-CM | POA: Insufficient documentation

## 2015-12-16 DIAGNOSIS — S81811A Laceration without foreign body, right lower leg, initial encounter: Secondary | ICD-10-CM | POA: Insufficient documentation

## 2015-12-16 DIAGNOSIS — S29001A Unspecified injury of muscle and tendon of front wall of thorax, initial encounter: Secondary | ICD-10-CM | POA: Diagnosis present

## 2015-12-16 DIAGNOSIS — Z8659 Personal history of other mental and behavioral disorders: Secondary | ICD-10-CM | POA: Diagnosis not present

## 2015-12-16 DIAGNOSIS — Z79899 Other long term (current) drug therapy: Secondary | ICD-10-CM | POA: Insufficient documentation

## 2015-12-16 DIAGNOSIS — Y998 Other external cause status: Secondary | ICD-10-CM | POA: Diagnosis not present

## 2015-12-16 LAB — CBC WITH DIFFERENTIAL/PLATELET
BASOS PCT: 0 %
Basophils Absolute: 0 10*3/uL (ref 0.0–0.1)
EOS ABS: 0.1 10*3/uL (ref 0.0–0.7)
Eosinophils Relative: 1 %
HEMATOCRIT: 40.3 % (ref 36.0–46.0)
Hemoglobin: 13.1 g/dL (ref 12.0–15.0)
LYMPHS ABS: 1.5 10*3/uL (ref 0.7–4.0)
LYMPHS PCT: 13 %
MCH: 27.7 pg (ref 26.0–34.0)
MCHC: 32.5 g/dL (ref 30.0–36.0)
MCV: 85.2 fL (ref 78.0–100.0)
MONOS PCT: 6 %
Monocytes Absolute: 0.6 10*3/uL (ref 0.1–1.0)
Neutro Abs: 9.2 10*3/uL — ABNORMAL HIGH (ref 1.7–7.7)
Neutrophils Relative %: 80 %
Platelets: 190 10*3/uL (ref 150–400)
RBC: 4.73 MIL/uL (ref 3.87–5.11)
RDW: 12.2 % (ref 11.5–15.5)
WBC: 11.4 10*3/uL — ABNORMAL HIGH (ref 4.0–10.5)

## 2015-12-16 LAB — COMPREHENSIVE METABOLIC PANEL
ALT: 40 U/L (ref 14–54)
AST: 56 U/L — ABNORMAL HIGH (ref 15–41)
Albumin: 3.8 g/dL (ref 3.5–5.0)
Alkaline Phosphatase: 92 U/L (ref 38–126)
Anion gap: 9 (ref 5–15)
BUN: 10 mg/dL (ref 6–20)
CO2: 26 mmol/L (ref 22–32)
Calcium: 9.1 mg/dL (ref 8.9–10.3)
Chloride: 106 mmol/L (ref 101–111)
Creatinine, Ser: 0.93 mg/dL (ref 0.44–1.00)
GFR calc Af Amer: >60 mL/min (ref >60)
GFR calc non Af Amer: >60 mL/min (ref >60)
Glucose, Bld: 116 mg/dL — ABNORMAL HIGH (ref 65–99)
Potassium: 3.9 mmol/L (ref 3.5–5.1)
Sodium: 141 mmol/L (ref 135–145)
Total Bilirubin: 0.6 mg/dL (ref 0.3–1.2)
Total Protein: 6.5 g/dL (ref 6.5–8.1)

## 2015-12-16 LAB — I-STAT TROPONIN, ED: Troponin i, poc: 0.03 ng/mL (ref 0.00–0.08)

## 2015-12-16 LAB — I-STAT CG4 LACTIC ACID, ED: Lactic Acid, Venous: 0.89 mmol/L (ref 0.5–2.0)

## 2015-12-16 LAB — I-STAT CHEM 8, ED
BUN: 11 mg/dL (ref 6–20)
Calcium, Ion: 1.17 mmol/L (ref 1.12–1.23)
Chloride: 102 mmol/L (ref 101–111)
Creatinine, Ser: 0.9 mg/dL (ref 0.44–1.00)
Glucose, Bld: 110 mg/dL — ABNORMAL HIGH (ref 65–99)
HCT: 44 % (ref 36.0–46.0)
Hemoglobin: 15 g/dL (ref 12.0–15.0)
Potassium: 3.7 mmol/L (ref 3.5–5.1)
Sodium: 142 mmol/L (ref 135–145)
TCO2: 27 mmol/L (ref 0–100)

## 2015-12-16 LAB — I-STAT BETA HCG BLOOD, ED (MC, WL, AP ONLY): I-stat hCG, quantitative: <5 m[IU]/mL (ref <5)

## 2015-12-16 LAB — ETHANOL

## 2015-12-16 LAB — PROTIME-INR
INR: 1.19 (ref 0.00–1.49)
PROTHROMBIN TIME: 15.3 s — AB (ref 11.6–15.2)

## 2015-12-16 LAB — SAMPLE TO BLOOD BANK

## 2015-12-16 MED ORDER — SODIUM CHLORIDE 0.9 % IV BOLUS (SEPSIS)
1000.0000 mL | Freq: Once | INTRAVENOUS | Status: AC
  Administered 2015-12-16: 1000 mL via INTRAVENOUS

## 2015-12-16 MED ORDER — HYDROMORPHONE HCL 1 MG/ML IJ SOLN
1.0000 mg | Freq: Once | INTRAMUSCULAR | Status: AC
  Administered 2015-12-16: 1 mg via INTRAVENOUS
  Filled 2015-12-16: qty 1

## 2015-12-16 MED ORDER — LIDOCAINE-EPINEPHRINE (PF) 2 %-1:200000 IJ SOLN
10.0000 mL | Freq: Once | INTRAMUSCULAR | Status: AC
  Administered 2015-12-16: 10 mL
  Filled 2015-12-16: qty 20

## 2015-12-16 MED ORDER — IOPAMIDOL (ISOVUE-300) INJECTION 61%
INTRAVENOUS | Status: AC
  Administered 2015-12-16: 100 mL
  Filled 2015-12-16: qty 100

## 2015-12-16 MED ORDER — SODIUM CHLORIDE 0.9 % IV SOLN: INTRAVENOUS | Status: DC

## 2015-12-16 MED ORDER — HYDROMORPHONE HCL 1 MG/ML IJ SOLN
0.5000 mg | Freq: Once | INTRAMUSCULAR | Status: AC
  Administered 2015-12-16: 0.5 mg via INTRAVENOUS
  Filled 2015-12-16: qty 1

## 2015-12-16 MED ORDER — ONDANSETRON HCL 4 MG/2ML IJ SOLN
4.0000 mg | Freq: Once | INTRAMUSCULAR | Status: AC
  Administered 2015-12-16: 4 mg via INTRAVENOUS
  Filled 2015-12-16: qty 2
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
## 2015-12-16 NOTE — ED Notes (Signed)
Pain meds given, pt resting quietly at this time.  Family at bedside.,

## 2015-12-16 NOTE — ED Notes (Signed)
Pt returned from CT °
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
## 2015-12-16 NOTE — ED Notes (Signed)
Pt to CT at this time.
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
## 2015-12-17 ENCOUNTER — Telehealth (HOSPITAL_BASED_OUTPATIENT_CLINIC_OR_DEPARTMENT_OTHER): Payer: Self-pay

## 2015-12-17 MED ORDER — OXYCODONE-ACETAMINOPHEN 5-325 MG PO TABS
1.0000 | ORAL_TABLET | Freq: Once | ORAL | Status: AC
Start: 2015-12-17 — End: 2015-12-17
  Administered 2015-12-17: 1 via ORAL
  Filled 2015-12-17: qty 1

## 2015-12-17 MED ORDER — OXYCODONE-ACETAMINOPHEN 5-325 MG PO TABS: 1.0000 | ORAL_TABLET | ORAL | Status: DC | PRN

## 2015-12-17 MED ORDER — CYCLOBENZAPRINE HCL 10 MG PO TABS: 5.0000 mg | ORAL_TABLET | Freq: Three times a day (TID) | ORAL | Status: DC | PRN

## 2015-12-17 MED ORDER — NAPROXEN 500 MG PO TABS: 500.0000 mg | ORAL_TABLET | Freq: Two times a day (BID) | ORAL | Status: DC
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
## 2015-12-23 ENCOUNTER — Emergency Department
Admission: EM | Admit: 2015-12-23 | Discharge: 2015-12-23 | Disposition: A | Discharge status: Home / Self Care | Payer: Medicaid Other | Attending: Emergency Medicine | Admitting: Emergency Medicine

## 2015-12-23 ENCOUNTER — Encounter: Payer: Self-pay | Admitting: Emergency Medicine

## 2015-12-23 DIAGNOSIS — F17210 Nicotine dependence, cigarettes, uncomplicated: Secondary | ICD-10-CM | POA: Diagnosis not present

## 2015-12-23 DIAGNOSIS — Y813 Surgical instruments, materials and general- and plastic-surgery devices (including sutures) associated with adverse incidents: Secondary | ICD-10-CM | POA: Insufficient documentation

## 2015-12-23 DIAGNOSIS — K122 Cellulitis and abscess of mouth: Secondary | ICD-10-CM | POA: Diagnosis not present

## 2015-12-23 DIAGNOSIS — T814XXA Infection following a procedure, initial encounter: Secondary | ICD-10-CM | POA: Diagnosis present

## 2015-12-23 MED ORDER — AMOXICILLIN-POT CLAVULANATE 875-125 MG PO TABS
1.0000 | ORAL_TABLET | Freq: Once | ORAL | Status: AC
  Administered 2015-12-23: 1 via ORAL
  Filled 2015-12-23: qty 1

## 2015-12-23 MED ORDER — OXYCODONE-ACETAMINOPHEN 5-325 MG PO TABS
1.0000 | ORAL_TABLET | Freq: Once | ORAL | Status: AC
  Administered 2015-12-23: 1 via ORAL
  Filled 2015-12-23: qty 1

## 2015-12-23 MED ORDER — AMOXICILLIN-POT CLAVULANATE 500-125 MG PO TABS: 1.0000 | ORAL_TABLET | Freq: Three times a day (TID) | ORAL | Status: AC
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
## 2015-12-23 NOTE — Discharge Instructions (Signed)
Cellulitis Cellulitis is an infection of the skin and the tissue beneath it. The infected area is usually red and tender. Cellulitis occurs most often in the arms and lower legs.  CAUSES  Cellulitis is caused by bacteria that enter the skin through cracks or cuts in the skin. The most common types of bacteria that cause cellulitis are staphylococci and streptococci. SIGNS AND SYMPTOMS   Redness and warmth.  Swelling.  Tenderness or pain.  Fever. DIAGNOSIS  Your health care provider can usually determine what is wrong based on a physical exam. Blood tests may also be done. TREATMENT  Treatment usually involves taking an antibiotic medicine. HOME CARE INSTRUCTIONS   Take your antibiotic medicine as directed by your health care provider. Finish the antibiotic even if you start to feel better.  Keep the infected arm or leg elevated to reduce swelling.  Apply a warm cloth to the affected area up to 4 times per day to relieve pain.  Take medicines only as directed by your health care provider.  Keep all follow-up visits as directed by your health care provider. SEEK MEDICAL CARE IF:   You notice red streaks coming from the infected area.  Your red area gets larger or turns dark in color.  Your bone or joint underneath the infected area becomes painful after the skin has healed.  Your infection returns in the same area or another area.  You notice a swollen bump in the infected area.  You develop new symptoms.  You have a fever. SEEK IMMEDIATE MEDICAL CARE IF:   You feel very sleepy.  You develop vomiting or diarrhea.  You have a general ill feeling (malaise) with muscle aches and pains.   This information is not intended to replace advice given to you by your health care provider. Make sure you discuss any questions you have with your health care provider.   Document Released: 05/02/2005 Document Revised: 04/13/2015 Document Reviewed: 10/08/2011 Elsevier Interactive  Patient Education 2016 [employer]   Continue mouth rinses with one half peroxide, one half water,  swish and spit, at least 4 times a day. Take antibiotics as directed. Return to the emergency room for any worsening symptoms.

## 2015-12-23 NOTE — ED Notes (Signed)
Pt discharged home after verbalizing understanding of discharge instructions; nad noted. 

## 2016-04-04 ENCOUNTER — Encounter: Payer: Self-pay | Admitting: Obstetrics and Gynecology

## 2016-04-04 ENCOUNTER — Ambulatory Visit: Payer: Medicaid Other | Admitting: Obstetrics and Gynecology

## 2016-05-21 ENCOUNTER — Encounter: Payer: Self-pay | Admitting: Emergency Medicine

## 2016-05-21 ENCOUNTER — Emergency Department
Admission: EM | Admit: 2016-05-21 | Discharge: 2016-05-21 | Disposition: A | Discharge status: Home / Self Care | Payer: Medicaid Other | Attending: Emergency Medicine | Admitting: Emergency Medicine

## 2016-05-21 DIAGNOSIS — F17210 Nicotine dependence, cigarettes, uncomplicated: Secondary | ICD-10-CM | POA: Diagnosis not present

## 2016-05-21 DIAGNOSIS — W268XXA Contact with other sharp object(s), not elsewhere classified, initial encounter: Secondary | ICD-10-CM | POA: Diagnosis not present

## 2016-05-21 DIAGNOSIS — S61214A Laceration without foreign body of right ring finger without damage to nail, initial encounter: Secondary | ICD-10-CM | POA: Diagnosis not present

## 2016-05-21 DIAGNOSIS — Y929 Unspecified place or not applicable: Secondary | ICD-10-CM | POA: Diagnosis not present

## 2016-05-21 DIAGNOSIS — Y939 Activity, unspecified: Secondary | ICD-10-CM | POA: Diagnosis not present

## 2016-05-21 DIAGNOSIS — Z79899 Other long term (current) drug therapy: Secondary | ICD-10-CM | POA: Diagnosis not present

## 2016-05-21 DIAGNOSIS — Y999 Unspecified external cause status: Secondary | ICD-10-CM | POA: Diagnosis not present

## 2016-05-21 DIAGNOSIS — S61219A Laceration without foreign body of unspecified finger without damage to nail, initial encounter: Secondary | ICD-10-CM

## 2016-05-21 MED ORDER — LIDOCAINE HCL (PF) 1 % IJ SOLN
INTRAMUSCULAR | Status: AC
  Administered 2016-05-21: 5 mL
  Filled 2016-05-21: qty 5

## 2016-05-21 MED ORDER — OXYCODONE-ACETAMINOPHEN 5-325 MG PO TABS: 1.0000 | ORAL_TABLET | Freq: Four times a day (QID) | ORAL | 0 refills | Status: DC | PRN

## 2016-05-21 MED ORDER — OXYCODONE-ACETAMINOPHEN 5-325 MG PO TABS
1.0000 | ORAL_TABLET | Freq: Once | ORAL | Status: AC
  Administered 2016-05-21: 1 via ORAL

## 2016-05-21 MED ORDER — OXYCODONE-ACETAMINOPHEN 5-325 MG PO TABS
ORAL_TABLET | ORAL | Status: AC
  Administered 2016-05-21: 1 via ORAL
  Filled 2016-05-21: qty 1

## 2016-05-21 NOTE — ED Triage Notes (Signed)
Pt to ed with c/o laceration to right hand fourth digit since last night.  Pt states she cut it on a can.

## 2016-05-21 NOTE — Discharge Instructions (Signed)
May have sutures removed [facility]

## 2016-05-21 NOTE — ED Provider Notes (Signed)
[facility] Emergency Department Provider Note   ____________________________________________   None    (approximate)  I have reviewed the triage vital signs and the nursing notes.   HISTORY  Chief Complaint Laceration    HPI Marilyn Gonzalez is a 25 y.o. female  complaining of laceration to the fourth digit right hand last night. Patient states she cut her hand on the lid of a can. Patient denies any loss sensation or loss of function of the affected digit.Patient rates the pain 7/10. Patient described a pain as "sharp". No palliative measures except for dressing applied to the area. Patient state her tetanus shot is up-to-date.   Past Medical History:  Diagnosis Date  . Anxiety   . Mental disorder     Patient Active Problem List   Diagnosis Date Noted  . Active labor at term 10/31/2015  . Labor and delivery, indication for care 07/11/2015  . Smoking trying to quit 04/26/2015  . Suboxone maintenance treatment complicating pregnancy, antepartum (HCC) 04/26/2015    Past Surgical History:  Procedure Laterality Date  . TONSILLECTOMY      Prior to Admission medications   Medication Sig Start Date End Date Taking? Authorizing Provider  amoxicillin-clavulanate (AUGMENTIN) 500-125 MG tablet Take 1 tablet (500 mg total) by mouth 3 (three) times daily. 12/23/15   Ignacia Bayley, PA-C  buprenorphine (SUBUTEX) 8 MG SUBL SL tablet Place 1 tablet under the tongue 2 (two) times daily. 12/15/15   Historical Provider, MD  buprenorphine-naloxone (SUBOXONE) 2-0.5 MG SUBL SL tablet Place 1 tablet under the tongue daily. Patient taking differently: Place 1 tablet under the tongue 2 (two) times daily.  04/26/15   Melody N Shambley, CNM  cyclobenzaprine (FLEXERIL) 10 MG tablet Take 0.5-1 tablets (5-10 mg total) by mouth 3 (three) times daily as needed for muscle spasms. 12/17/15   Arthor Captain, PA-C  medroxyPROGESTERone (DEPO-PROVERA) 150 MG/ML injection Inject 1 mL  (150 mg total) into the muscle every 3 (three) months. 11/02/15   Melody N Shambley, CNM  naproxen (NAPROSYN) 500 MG tablet Take 1 tablet (500 mg total) by mouth 2 (two) times daily with a meal. 12/17/15   Arthor Captain, PA-C  oxyCODONE-acetaminophen (PERCOCET) 5-325 MG tablet Take 1-2 tablets by mouth every 4 (four) hours as needed. 12/17/15   Arthor Captain, PA-C  oxyCODONE-acetaminophen (ROXICET) 5-325 MG tablet Take 1 tablet by mouth every 6 (six) hours as needed for moderate pain. 05/21/16   Joni Reining, PA-C    Allergies Review of patient's allergies indicates no known allergies.  Family History  Problem Relation Age of Onset  . Heart disease Maternal Grandmother   . Diabetes Maternal Grandmother   . Heart disease Paternal Grandfather     Social History Social History  Substance Use Topics  . Smoking status: Current Every Day Smoker    Types: Cigarettes  . Smokeless tobacco: Never Used  . Alcohol use No    Review of Systems Constitutional: No fever/chills Eyes: No visual changes. ENT: No sore throat. Cardiovascular: Denies chest pain. Respiratory: Denies shortness of breath. Gastrointestinal: No abdominal pain.  No nausea, no vomiting.  No diarrhea.  No constipation. Genitourinary: Negative for dysuria. Musculoskeletal: Negative for back pain. Skin: Negative for rash. Neurological: Negative for headaches, focal weakness or numbness. Psychiatric:Anxiety  ____________________________________________   PHYSICAL EXAM:  VITAL SIGNS: ED Triage Vitals  Enc Vitals Group     BP 05/21/16 1216 105/67     Pulse Rate 05/21/16 1216 73  Resp 05/21/16 1216 18     Temp 05/21/16 1216 97.9 F (36.6 C)     Temp Source 05/21/16 1216 Oral     SpO2 05/21/16 1216 98 %     Weight --      Height --      Head Circumference --      Peak Flow --      Pain Score 05/21/16 1206 7     Pain Loc --      Pain Edu? --      Excl. in GC? --     Constitutional: Alert and oriented.  Well appearing and in no acute distress. Eyes: Conjunctivae are normal. PERRL. EOMI. Head: Atraumatic. Nose: No congestion/rhinnorhea. Mouth/Throat: Mucous membranes are moist.  Oropharynx non-erythematous. Neck: No stridor.  No cervical spine tenderness to palpation. Hematological/Lymphatic/Immunilogical: No cervical lymphadenopathy. Cardiovascular: Normal rate, regular rhythm. Grossly normal heart sounds.  Good peripheral circulation. Respiratory: Normal respiratory effort.  No retractions. Lungs CTAB. Gastrointestinal: Soft and nontender. No distention. No abdominal bruits. No CVA tenderness. Musculoskeletal: No lower extremity tenderness nor edema.  No joint effusions. Neurologic:  Normal speech and language. No gross focal neurologic deficits are appreciated. No gait instability. Skin:  Skin is warm, dry and intact. No rash noted. Psychiatric: Mood and affect are normal. Speech and behavior are normal.  ____________________________________________   LABS (all labs ordered are listed, but only abnormal results are displayed)  Labs Reviewed - No data to display ____________________________________________  EKG   ____________________________________________  RADIOLOGY   ____________________________________________   PROCEDURES  Procedure(s) performed: LACERATION REPAIR Performed by: Joni Reiningonald K Eyana Stolze Authorized by: Joni Reiningonald K Alechia Lezama Consent: Verbal consent obtained. Risks and benefits: risks, benefits and alternatives were discussed Consent given by: patient Patient identity confirmed: provided demographic data Prepped and Draped in normal sterile fashion Wound explored  Laceration Location: Fourth digit right hand  Laceration Length: 0.5cm  No Foreign Bodies seen or palpated  Anesthesia: Digital block   Local anesthetic: lidocaine 1% without epinephrine  Anesthetic total: 5 ml  Irrigation method: syringe Amount of cleaning: standard  Skin closure:4-0    Number of sutures: 7 Technique: Interrupted Patient tolerance: Patient tolerated the procedure well with no immediate complications.   Procedures  Critical Care performed: No  ____________________________________________   INITIAL IMPRESSION / ASSESSMENT AND PLAN / ED COURSE  Pertinent labs & imaging results that were available during my care of the patient were reviewed by me and considered in my medical decision making (see chart for details).  Finger laceration fourth digit right hand. Patient given discharge care instructions. Patient advised his sut[facility] in 10 days.  Clinical Course     ____________________________________________   FINAL CLINICAL IMPRESSION(S) / ED DIAGNOSES  Final diagnoses:  Finger laceration, initial encounter      NEW MEDICATIONS STARTED DURING THIS VISIT:  New Prescriptions   OXYCODONE-ACETAMINOPHEN (ROXICET) 5-325 MG TABLET    Take 1 tablet by mouth every 6 (six) hours as needed for moderate pain.     Note:  This document was prepared using Dragon voice recognition software and may include unintentional dictation errors.    Joni Reiningonald K Meyli Boice, PA-C 05/21/16 1343    Sharman CheekPhillip Stafford, MD 05/21/16 [phone]

## 2016-06-18 ENCOUNTER — Emergency Department
Admission: EM | Admit: 2016-06-18 | Discharge: 2016-06-18 | Disposition: A | Discharge status: Home / Self Care | Payer: No Typology Code available for payment source | Attending: Emergency Medicine | Admitting: Emergency Medicine

## 2016-06-18 ENCOUNTER — Emergency Department: Payer: No Typology Code available for payment source

## 2016-06-18 DIAGNOSIS — Y92481 Parking lot as the place of occurrence of the external cause: Secondary | ICD-10-CM | POA: Diagnosis not present

## 2016-06-18 DIAGNOSIS — Y9301 Activity, walking, marching and hiking: Secondary | ICD-10-CM | POA: Insufficient documentation

## 2016-06-18 DIAGNOSIS — Y999 Unspecified external cause status: Secondary | ICD-10-CM | POA: Insufficient documentation

## 2016-06-18 DIAGNOSIS — S40022A Contusion of left upper arm, initial encounter: Secondary | ICD-10-CM | POA: Diagnosis not present

## 2016-06-18 DIAGNOSIS — S20212A Contusion of left front wall of thorax, initial encounter: Secondary | ICD-10-CM | POA: Insufficient documentation

## 2016-06-18 DIAGNOSIS — Z791 Long term (current) use of non-steroidal anti-inflammatories (NSAID): Secondary | ICD-10-CM | POA: Insufficient documentation

## 2016-06-18 DIAGNOSIS — F17210 Nicotine dependence, cigarettes, uncomplicated: Secondary | ICD-10-CM | POA: Diagnosis not present

## 2016-06-18 DIAGNOSIS — Z79899 Other long term (current) drug therapy: Secondary | ICD-10-CM | POA: Diagnosis not present

## 2016-06-18 DIAGNOSIS — S299XXA Unspecified injury of thorax, initial encounter: Secondary | ICD-10-CM | POA: Diagnosis present

## 2016-06-18 MED ORDER — IBUPROFEN 800 MG PO TABS
800.0000 mg | ORAL_TABLET | Freq: Once | ORAL | Status: AC
  Administered 2016-06-18: 800 mg via ORAL
  Filled 2016-06-18: qty 1
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
## 2016-07-18 ENCOUNTER — Telehealth: Payer: Self-pay | Admitting: Obstetrics and Gynecology

## 2016-07-20 ENCOUNTER — Encounter: Payer: Self-pay | Admitting: Emergency Medicine

## 2016-07-20 ENCOUNTER — Emergency Department
Admission: EM | Admit: 2016-07-20 | Discharge: 2016-07-20 | Disposition: A | Discharge status: Home / Self Care | Payer: No Typology Code available for payment source | Attending: Emergency Medicine | Admitting: Emergency Medicine

## 2016-07-20 ENCOUNTER — Emergency Department: Payer: No Typology Code available for payment source

## 2016-07-20 DIAGNOSIS — S4992XD Unspecified injury of left shoulder and upper arm, subsequent encounter: Secondary | ICD-10-CM | POA: Diagnosis present

## 2016-07-20 DIAGNOSIS — Z79899 Other long term (current) drug therapy: Secondary | ICD-10-CM | POA: Insufficient documentation

## 2016-07-20 DIAGNOSIS — M79602 Pain in left arm: Secondary | ICD-10-CM | POA: Diagnosis not present

## 2016-07-20 DIAGNOSIS — Z791 Long term (current) use of non-steroidal anti-inflammatories (NSAID): Secondary | ICD-10-CM | POA: Diagnosis not present

## 2016-07-20 DIAGNOSIS — F17210 Nicotine dependence, cigarettes, uncomplicated: Secondary | ICD-10-CM | POA: Diagnosis not present

## 2016-07-20 NOTE — Discharge Instructions (Signed)
Continue ibuprofen as needed for pain and inflammation. Keep your appointment with [name] in January. You may continue to use ice to the arm as needed for pain and inflammation.

## 2016-07-20 NOTE — ED Notes (Signed)
E-signature pad not working, pt verbalized understanding of discharge instructions.  
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
## 2016-07-23 NOTE — Telephone Encounter (Signed)
error 

## 2016-11-30 ENCOUNTER — Encounter (HOSPITAL_COMMUNITY): Payer: Self-pay

## 2016-11-30 ENCOUNTER — Emergency Department (HOSPITAL_COMMUNITY)
Admission: EM | Admit: 2016-11-30 | Discharge: 2016-11-30 | Disposition: A | Discharge status: Home / Self Care | Payer: Medicaid Other | Attending: Emergency Medicine | Admitting: Emergency Medicine

## 2016-11-30 DIAGNOSIS — B850 Pediculosis due to Pediculus humanus capitis: Secondary | ICD-10-CM | POA: Diagnosis present

## 2016-11-30 DIAGNOSIS — Z79899 Other long term (current) drug therapy: Secondary | ICD-10-CM | POA: Insufficient documentation

## 2016-11-30 DIAGNOSIS — F17210 Nicotine dependence, cigarettes, uncomplicated: Secondary | ICD-10-CM | POA: Insufficient documentation

## 2016-11-30 MED ORDER — PERMETHRIN 5 % EX CREA: TOPICAL_CREAM | CUTANEOUS | 0 refills | Status: DC

## 2016-11-30 NOTE — ED Triage Notes (Signed)
Seeking treatment for lice after relative Dx'd today

## 2016-11-30 NOTE — ED Triage Notes (Signed)
Mother reports she has had head lice for one month and cant get rid of it

## 2016-11-30 NOTE — Discharge Instructions (Signed)
Use the cream as directed.  Make sure you wash all the bed linens and clothing in hot water and replace or boil all the hairbrushes

## 2016-12-04 NOTE — ED Provider Notes (Signed)
[facility]-EMERGENCY DEPT Provider Note   CSN: [id number] Arrival date & time: 11/30/16  1742     History   Chief Complaint Chief Complaint  Patient presents with  . Head Lice    HPI Marilyn Gonzalez is a 26 y.o. female.  HPI  Marilyn Gonzalez is a 26 y.o. female who presents to the Emergency Department complaining of head lice.  She states that she has had lice for one month and has been treated multiple times with OTC RID without resolution.  She also states that her daughter has similar symptoms and is here to be treated as well.  She complains of itching of her scalp.  She denies fever, rash.  Nothing makes her symptoms better or worse.   Past Medical History:  Diagnosis Date  . Anxiety   . Mental disorder     Patient Active Problem List   Diagnosis Date Noted  . Active labor at term 10/31/2015  . Labor and delivery, indication for care 07/11/2015  . Smoking trying to quit 04/26/2015  . Suboxone maintenance treatment complicating pregnancy, antepartum (HCC) 04/26/2015    Past Surgical History:  Procedure Laterality Date  . TONSILLECTOMY      OB History    Gravida Para Term Preterm AB Living   SAB TAB Ectopic Multiple Live Births         0 1       Home Medications    Prior to Admission medications   Medication Sig Start Date End Date Taking? Authorizing Provider  amoxicillin-clavulanate (AUGMENTIN) 500-125 MG tablet Take 1 tablet (500 mg total) by mouth 3 (three) times daily. 12/23/15   Ignacia Bayley, PA-C  buprenorphine (SUBUTEX) 8 MG SUBL SL tablet Place 1 tablet under the tongue 2 (two) times daily. 12/15/15   Historical Provider, MD  buprenorphine-naloxone (SUBOXONE) 2-0.5 MG SUBL SL tablet Place 1 tablet under the tongue daily. Patient taking differently: Place 1 tablet under the tongue 2 (two) times daily.  04/26/15   Melody N Shambley, CNM  cyclobenzaprine (FLEXERIL) 10 MG tablet Take 0.5-1 tablets (5-10 mg total) by mouth 3 (three) times  daily as needed for muscle spasms. 12/17/15   Arthor Captain, PA-C  medroxyPROGESTERone (DEPO-PROVERA) 150 MG/ML injection Inject 1 mL (150 mg total) into the muscle every 3 (three) months. 11/02/15   Melody N Shambley, CNM  naproxen (NAPROSYN) 500 MG tablet Take 1 tablet (500 mg total) by mouth 2 (two) times daily with a meal. 12/17/15   Arthor Captain, PA-C  oxyCODONE-acetaminophen (PERCOCET) 5-325 MG tablet Take 1-2 tablets by mouth every 4 (four) hours as needed. 12/17/15   Arthor Captain, PA-C  oxyCODONE-acetaminophen (ROXICET) 5-325 MG tablet Take 1 tablet by mouth every 6 (six) hours as needed for moderate pain. 05/21/16   Joni Reining, PA-C  permethrin (ELIMITE) 5 % cream Massage to the scalp and through the hair.  Leave on fro 10  Minutes then rinse.  May repeat in 7 days if needed. 11/30/16   Aceson Labell, PA-C    Family History Family History  Problem Relation Age of Onset  . Heart disease Maternal Grandmother   . Diabetes Maternal Grandmother   . Heart disease Paternal Grandfather     Social History Social History  Substance Use Topics  . Smoking status: Current Every Day Smoker    Types: Cigarettes  . Smokeless tobacco: Never Used  . Alcohol use No     Allergies  Suboxone [buprenorphine hcl-naloxone hcl]   Review of Systems Review of Systems  Constitutional: Negative for activity change, appetite change, chills and fever.  HENT: Negative for facial swelling, sore throat and trouble swallowing.        Heal lice, itching of scalp  Respiratory: Negative for chest tightness.   Musculoskeletal: Negative for neck pain and neck stiffness.  Skin: Positive for rash. Negative for wound.  Neurological: Negative for dizziness, weakness, numbness and headaches.  All other systems reviewed and are negative.    Physical Exam Updated Vital Signs BP 113/68 (BP Location: Right Arm)   Pulse 89   Temp 98 F (36.7 C) (Oral)   Resp 18   LMP 11/29/2016   SpO2 98%   Physical  Exam  Constitutional: She is oriented to person, place, and time. She appears well-developed and well-nourished. No distress.  HENT:  Head: Normocephalic.  Mouth/Throat: Oropharynx is clear and moist.  Lice visualized with nits also present and attached to strands of hair..  Eyes: Pupils are equal, round, and reactive to light.  Neck: Normal range of motion. Neck supple. No Kernig's sign noted. No thyromegaly present.  Cardiovascular: Normal rate, regular rhythm and normal heart sounds.   Pulmonary/Chest: Effort normal and breath sounds normal. She has no wheezes.  Abdominal: Normal appearance.  Musculoskeletal: Normal range of motion.  Neurological: She is alert and oriented to person, place, and time.  Skin: Skin is warm and dry. No rash noted.  Nursing note and vitals reviewed.    ED Treatments / Results  Labs (all labs ordered are listed, but only abnormal results are displayed) Labs Reviewed - No data to display  EKG  EKG Interpretation None       Radiology No results found.  Procedures Procedures (including critical care time)  Medications Ordered in ED Medications - No data to display   Initial Impression / Assessment and Plan / ED Course  I have reviewed the triage vital signs and the nursing notes.  Pertinent labs & imaging results that were available during my care of the patient were reviewed by me and considered in my medical decision making (see chart for details).     Rx of permethrin. Pt counseled on proper usage and to comb out nits, treat all linens, clothing and advised may need second tx .    Final Clinical Impressions(s) / ED Diagnoses   Final diagnoses:  Pediculosis capitis    New Prescriptions Discharge Medication List as of 11/30/2016  6:09 PM    START taking these medications   Details  permethrin (ELIMITE) 5 % cream Massage to the scalp and through the hair.  Leave on fro 10  Minutes then rinse.  May repeat in 7 days if needed.,  Print         Pauline Aus, [place] 12/04/16 2151    Bethann Berkshire, MD 12/05/16 [phone]
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
## 2020-10-28 ENCOUNTER — Emergency Department: Payer: Medicaid Other

## 2020-10-28 ENCOUNTER — Emergency Department
Admission: EM | Admit: 2020-10-28 | Discharge: 2020-10-28 | Disposition: A | Discharge status: Home / Self Care | Payer: Medicaid Other | Attending: Emergency Medicine | Admitting: Emergency Medicine

## 2020-10-28 ENCOUNTER — Encounter: Payer: Self-pay | Admitting: Emergency Medicine

## 2020-10-28 ENCOUNTER — Other Ambulatory Visit: Payer: Self-pay

## 2020-10-28 DIAGNOSIS — R1030 Lower abdominal pain, unspecified: Secondary | ICD-10-CM | POA: Insufficient documentation

## 2020-10-28 DIAGNOSIS — F17210 Nicotine dependence, cigarettes, uncomplicated: Secondary | ICD-10-CM | POA: Insufficient documentation

## 2020-10-28 DIAGNOSIS — R109 Unspecified abdominal pain: Secondary | ICD-10-CM

## 2020-10-28 DIAGNOSIS — Z3A01 Less than 8 weeks gestation of pregnancy: Secondary | ICD-10-CM | POA: Diagnosis not present

## 2020-10-28 DIAGNOSIS — O26891 Other specified pregnancy related conditions, first trimester: Secondary | ICD-10-CM | POA: Insufficient documentation

## 2020-10-28 DIAGNOSIS — O99331 Smoking (tobacco) complicating pregnancy, first trimester: Secondary | ICD-10-CM | POA: Diagnosis not present

## 2020-10-28 LAB — CBC
HCT: 39 % (ref 36.0–46.0)
Hemoglobin: 13 g/dL (ref 12.0–15.0)
MCH: 28 pg (ref 26.0–34.0)
MCHC: 33.3 g/dL (ref 30.0–36.0)
MCV: 83.9 fL (ref 80.0–100.0)
Platelets: 225 10*3/uL (ref 150–400)
RBC: 4.65 MIL/uL (ref 3.87–5.11)
RDW: 12.7 % (ref 11.5–15.5)
WBC: 8.6 10*3/uL (ref 4.0–10.5)
nRBC: 0 % (ref 0.0–0.2)

## 2020-10-28 LAB — URINALYSIS, COMPLETE (UACMP) WITH MICROSCOPIC
Bilirubin Urine: NEGATIVE
Glucose, UA: NEGATIVE mg/dL
Hgb urine dipstick: NEGATIVE
Ketones, ur: NEGATIVE mg/dL
Nitrite: NEGATIVE
Protein, ur: NEGATIVE mg/dL
Specific Gravity, Urine: 1.018 (ref 1.005–1.030)
pH: 6 (ref 5.0–8.0)

## 2020-10-28 LAB — POC URINE PREG, ED: Preg Test, Ur: POSITIVE — AB

## 2020-10-28 LAB — COMPREHENSIVE METABOLIC PANEL
ALT: 15 U/L (ref 0–44)
AST: 19 U/L (ref 15–41)
Albumin: 4.2 g/dL (ref 3.5–5.0)
Alkaline Phosphatase: 51 U/L (ref 38–126)
Anion gap: 7 (ref 5–15)
BUN: 10 mg/dL (ref 6–20)
CO2: 26 mmol/L (ref 22–32)
Calcium: 9.1 mg/dL (ref 8.9–10.3)
Chloride: 101 mmol/L (ref 98–111)
Creatinine, Ser: 0.59 mg/dL (ref 0.44–1.00)
GFR, Estimated: >60 mL/min (ref >60)
Glucose, Bld: 80 mg/dL (ref 70–99)
Potassium: 3.2 mmol/L — ABNORMAL LOW (ref 3.5–5.1)
Sodium: 134 mmol/L — ABNORMAL LOW (ref 135–145)
Total Bilirubin: 0.9 mg/dL (ref 0.3–1.2)
Total Protein: 7.1 g/dL (ref 6.5–8.1)

## 2020-10-28 LAB — HCG, QUANTITATIVE, PREGNANCY: hCG, Beta Chain, Quant, S: 118646 m[IU]/mL — ABNORMAL HIGH (ref <5)

## 2020-10-28 LAB — LIPASE, BLOOD: Lipase: 30 U/L (ref 11–51)

## 2020-10-28 MED ORDER — FOSFOMYCIN TROMETHAMINE 3 G PO PACK
3.0000 g | PACK | Freq: Once | ORAL | Status: AC
  Administered 2020-10-28: 3 g via ORAL
  Filled 2020-10-28: qty 3

## 2020-10-28 NOTE — ED Triage Notes (Signed)
Pt to ED via POV, pt states that she has had 3 at home pregnancy test come back positive. Pt states that she is having pain on both sides of her abdomen. Pt denies bleeding. Pt is unsure of how far a long she may be. Pt states that her LMP was last month and that it was normal. Pt is in NAD.

## 2020-10-28 NOTE — ED Provider Notes (Signed)
[facility] Emergency Department Provider Note  Time seen: 7:18 PM  I have reviewed the triage vital signs and the nursing notes.   HISTORY  Chief Complaint Abdominal Pain   HPI Marilyn Gonzalez is a 30 y.o. female G3, P2 who presents to the emergency department for lower abdominal discomfort and a positive pregnancy test.  According to the patient she is approximately 1 week or so late on her menstrual cycle, took a home pregnancy test that was positive.  States today she developed some lower abdominal discomfort was concerned so she came to the emergency department.  Denies any vaginal bleeding or discharge.  No fever or dysuria.   Past Medical History:  Diagnosis Date  . Anxiety   . Mental disorder     Patient Active Problem List   Diagnosis Date Noted  . Active labor at term 10/31/2015  . Labor and delivery, indication for care 07/11/2015  . Smoking trying to quit 04/26/2015  . Suboxone maintenance treatment complicating pregnancy, antepartum (HCC) 04/26/2015    Past Surgical History:  Procedure Laterality Date  . TONSILLECTOMY      Prior to Admission medications   Medication Sig Start Date End Date Taking? Authorizing Provider  amoxicillin-clavulanate (AUGMENTIN) 500-125 MG tablet Take 1 tablet (500 mg total) by mouth 3 (three) times daily. 12/23/15   Ignacia Bayley, PA-C  buprenorphine (SUBUTEX) 8 MG SUBL SL tablet Place 1 tablet under the tongue 2 (two) times daily. 12/15/15   [provider]  buprenorphine-naloxone (SUBOXONE) 2-0.5 MG SUBL SL tablet Place 1 tablet under the tongue daily. Patient taking differently: Place 1 tablet under the tongue 2 (two) times daily.  04/26/15   Shambley, Melody N, CNM  cyclobenzaprine (FLEXERIL) 10 MG tablet Take 0.5-1 tablets (5-10 mg total) by mouth 3 (three) times daily as needed for muscle spasms. 12/17/15   Arthor Captain, PA-C  medroxyPROGESTERone (DEPO-PROVERA) 150 MG/ML injection Inject 1 mL (150  mg total) into the muscle every 3 (three) months. 11/02/15   Shambley, Melody N, CNM  naproxen (NAPROSYN) 500 MG tablet Take 1 tablet (500 mg total) by mouth 2 (two) times daily with a meal. 12/17/15   Harris, Abigail, PA-C  oxyCODONE-acetaminophen (PERCOCET) 5-325 MG tablet Take 1-2 tablets by mouth every 4 (four) hours as needed. 12/17/15   Arthor Captain, PA-C  oxyCODONE-acetaminophen (ROXICET) 5-325 MG tablet Take 1 tablet by mouth every 6 (six) hours as needed for moderate pain. 05/21/16   Joni Reining, PA-C  permethrin (ELIMITE) 5 % cream Massage to the scalp and through the hair.  Leave on fro 10  Minutes then rinse.  May repeat in 7 days if needed. 11/30/16   Triplett, Tammy, PA-C    Allergies  Allergen Reactions  . Suboxone [Buprenorphine Hcl-Naloxone Hcl] Swelling    Family History  Problem Relation Age of Onset  . Heart disease Maternal Grandmother   . Diabetes Maternal Grandmother   . Heart disease Paternal Grandfather     Social History Social History   Tobacco Use  . Smoking status: Current Every Day Smoker    Types: Cigarettes  . Smokeless tobacco: Never Used  Substance Use Topics  . Alcohol use: No  . Drug use: No    Types: Benzodiazepines, Morphine, Marijuana    Review of Systems Constitutional: Negative for fever. Cardiovascular: Negative for chest pain. Respiratory: Negative for shortness of breath. Gastrointestinal: Mild lower abdominal pain.  Negative for nausea vomiting or diarrhea Genitourinary: Negative for urinary compaints.  Negative for vaginal bleeding or discharge. Musculoskeletal: Negative for musculoskeletal complaints Neurological: Negative for headache All other ROS negative  ____________________________________________   PHYSICAL EXAM:  VITAL SIGNS: ED Triage Vitals  Enc Vitals Group     BP 10/28/20 1845 100/70     Pulse Rate 10/28/20 1845 90     Resp 10/28/20 1845 16     Temp 10/28/20 1845 98.4 F (36.9 C)     Temp Source  10/28/20 1845 Oral     SpO2 10/28/20 1845 100 %     Weight 10/28/20 1846 120 lb (54.4 kg)     Height 10/28/20 1846 5\' 3"  (1.6 m)     Head Circumference --      Peak Flow --      Pain Score 10/28/20 1846 6     Pain Loc --      Pain Edu? --      Excl. in GC? --     Constitutional: Alert and oriented. Well appearing and in no distress. Eyes: Normal exam ENT      Head: Normocephalic and atraumatic.      Mouth/Throat: Mucous membranes are moist. Cardiovascular: Normal rate, regular rhythm.  Respiratory: Normal respiratory effort without tachypnea nor retractions. Breath sounds are clear  Gastrointestinal: Soft and nontender. No distention.   Musculoskeletal: Nontender with normal range of motion in all extremities.  Neurologic:  Normal speech and language. No gross focal neurologic deficits  Skin:  Skin is warm, dry and intact.  Psychiatric: Mood and affect are normal.   ____________________________________________     RADIOLOGY   IMPRESSION:  1. Single viable intrauterine pregnancy as above, estimated  gestational age [redacted] weeks and 3 days by crown-rump length, with  ultrasound EDC of 06/13/2021.  2. Small subchorionic hemorrhage without associated mass effect.  3. Small degenerating right ovarian corpus luteal cyst. No other  acute maternal uterine or adnexal abnormality.   ____________________________________________   INITIAL IMPRESSION / ASSESSMENT AND PLAN / ED COURSE  Pertinent labs & imaging results that were available during my care of the patient were reviewed by me and considered in my medical decision making (see chart for details).   Patient presents emergency department with a positive pregnancy test at home and now some lower abdominal discomfort.  Denies any vaginal bleeding or discharge dysuria or fever.  Overall the patient appears well benign abdominal exam.  Reassuring vitals.  Lab work is reassuring.  Urine pregnancy test is positive we will obtain a  quantitative beta hCG as well as a OB ultrasound to further evaluate.  Patient agreeable to plan of care.  Beta hCG is resulted at 118,000.  Urinalysis shows nitrite positive we will dose a one-time dose of fosfomycin.  Ultrasound resulted showing a single live intrauterine pregnancy of 7 weeks and 3 days with a normal heart rate of 140 bpm.  Small subchorionic hemorrhage.  Marilyn Gonzalez was evaluated in Emergency Department on 10/28/2020 for the symptoms described in the history of present illness. She was evaluated in the context of the global COVID-19 pandemic, which necessitated consideration that the patient might be at risk for infection with the SARS-CoV-2 virus that causes COVID-19. Institutional protocols and algorithms that pertain to the evaluation of patients at risk for COVID-19 are in a state of rapid change based on information released by regulatory bodies including the CDC and federal and state organizations. These policies and algorithms were followed during the patient's care in the ED.  ____________________________________________   FINAL  CLINICAL IMPRESSION(S) / ED DIAGNOSES  Abdominal pain during pregnancy   Minna Antis, MD 10/28/20 2047

## 2020-10-28 NOTE — ED Triage Notes (Signed)
First Nurse Note:  C/O abdominal pain x 1-2 weeks.    Patient is AAOx3.  Skin warm and dry. NAD.  Posture upright and relaxed.  NAD

## 2020-12-30 ENCOUNTER — Encounter: Payer: Self-pay | Admitting: Obstetrics and Gynecology

## 2021-01-03 ENCOUNTER — Telehealth: Payer: Self-pay

## 2021-01-03 ENCOUNTER — Other Ambulatory Visit: Payer: Self-pay

## 2021-01-03 ENCOUNTER — Ambulatory Visit (INDEPENDENT_AMBULATORY_CARE_PROVIDER_SITE_OTHER): Payer: Medicaid Other | Admitting: Obstetrics and Gynecology

## 2021-01-03 ENCOUNTER — Other Ambulatory Visit (HOSPITAL_COMMUNITY)
Admission: RE | Admit: 2021-01-03 | Discharge: 2021-01-03 | Disposition: A | Discharge status: Home / Self Care | Payer: Medicaid Other | Source: Ambulatory Visit | Attending: Obstetrics and Gynecology | Admitting: Obstetrics and Gynecology

## 2021-01-03 ENCOUNTER — Encounter: Payer: Self-pay | Admitting: Obstetrics and Gynecology

## 2021-01-03 VITALS — BP 93/63 | HR 91 | Wt 120.7 lb

## 2021-01-03 DIAGNOSIS — Z113 Encounter for screening for infections with a predominantly sexual mode of transmission: Secondary | ICD-10-CM

## 2021-01-03 DIAGNOSIS — Z1379 Encounter for other screening for genetic and chromosomal anomalies: Secondary | ICD-10-CM

## 2021-01-03 DIAGNOSIS — O99320 Drug use complicating pregnancy, unspecified trimester: Secondary | ICD-10-CM | POA: Diagnosis not present

## 2021-01-03 DIAGNOSIS — O0972 Supervision of high risk pregnancy due to social problems, second trimester: Secondary | ICD-10-CM

## 2021-01-03 DIAGNOSIS — Z124 Encounter for screening for malignant neoplasm of cervix: Secondary | ICD-10-CM

## 2021-01-03 DIAGNOSIS — F1120 Opioid dependence, uncomplicated: Secondary | ICD-10-CM

## 2021-01-03 DIAGNOSIS — Z0283 Encounter for blood-alcohol and blood-drug test: Secondary | ICD-10-CM | POA: Diagnosis not present

## 2021-01-03 DIAGNOSIS — Z8659 Personal history of other mental and behavioral disorders: Secondary | ICD-10-CM

## 2021-01-03 NOTE — Patient Instructions (Signed)

## 2021-01-03 NOTE — Telephone Encounter (Signed)
Pt called no answer number disconnect.

## 2021-01-03 NOTE — Progress Notes (Signed)
OBSTETRIC INITIAL PRENATAL VISIT  Subjective:    Marilyn Gonzalez is being seen today for her first obstetrical visit.  This is not a planned pregnancy. She is a 30 y.o. [id number] female at [redacted]w[redacted]d gestation, Estimated Date of Delivery: 06/13/21 with No LMP recorded (lmp unknown). Patient is pregnant.,  Based on 7 week ER ultrasound.  Her obstetrical history is significant for suboxone use in pregnancy. Relationship with FOB: significant other, living together. Patient is unsure of her intent to breast feed. Pregnancy history fully reviewed.  Patient reports that she is currently using ~ 4 mg per day of her Suboxone.     OB History  Gravida Para Term Preterm AB Living  3 2 2  0 0 2  SAB IAB Ectopic Multiple Live Births  0 0 0 0 2    # Outcome Date GA Lbr Len/2nd Weight Sex Delivery Anes PTL Lv  3 Current           2 Term 10/31/15 [redacted]w[redacted]d  6 lb 11.2 oz (3.04 kg) F Vag-Spont EPI       Name: Marilyn, SCHMOKER Gonzalez     Apgar1: 8  Apgar5: 9  1 Term 2013 [redacted]w[redacted]d  6 lb 13 oz (3.09 kg) F Vag-Spont EPI  LIV    Gynecologic History:  Last pap smear was: patient cannot recall. Denies/admits h/o abnormal pap smears in the past.  Denies history of STIs.  Contraception: none   Past Medical History:  Diagnosis Date  . Anxiety   . Mental disorder     Family History  Problem Relation Age of Onset  . Heart disease Maternal Grandmother   . Diabetes Maternal Grandmother   . Heart disease Paternal Grandfather   . Healthy Mother   . Healthy Father     Past Surgical History:  Procedure Laterality Date  . TONSILLECTOMY      Social History   Socioeconomic History  . Marital status: Married    Spouse name: Not on file  . Number of children: Not on file  . Years of education: Not on file  . Highest education level: Not on file  Occupational History  . Not on file  Tobacco Use  . Smoking status: Current Every Day Smoker    Types: Cigarettes  . Smokeless tobacco: Never Used  Vaping Use   . Vaping Use: Never used  Substance and Sexual Activity  . Alcohol use: No  . Drug use: No    Types: Benzodiazepines, Morphine, Marijuana  . Sexual activity: Yes    Birth control/protection: None    Comment: Pregnant  Other Topics Concern  . Not on file  Social History Narrative  . Not on file   Social Determinants of Health   Financial Resource Strain: Not on file  Food Insecurity: Not on file  Transportation Needs: Not on file  Physical Activity: Not on file  Stress: Not on file  Social Connections: Not on file  Intimate Partner Violence: Not on file    Current Outpatient Medications on File Prior to Visit  Medication Sig Dispense Refill  . buprenorphine (SUBUTEX) 8 MG SUBL SL tablet Place 1 tablet under the tongue 2 (two) times daily.  0  . amoxicillin-clavulanate (AUGMENTIN) 500-125 MG tablet Take 1 tablet (500 mg total) by mouth 3 (three) times daily. (Patient not taking: Reported on 01/03/2021) 30 tablet 0   No current facility-administered medications on file prior to visit.    Allergies  Allergen Reactions  . Suboxone [Buprenorphine Hcl-Naloxone Hcl]  Swelling     Review of Systems General: Not Present- Fever, Weight Loss and Weight Gain. Skin: Not Present- Rash. HEENT: Not Present- Blurred Vision, Headache and Bleeding Gums. Respiratory: Not Present- Difficulty Breathing. Breast: Not Present- Breast Mass. Cardiovascular: Not Present- Chest Pain, Elevated Blood Pressure, Fainting / Blacking Out and Shortness of Breath. Gastrointestinal: Not Present- Abdominal Pain, Constipation, Nausea and Vomiting. Female Genitourinary: Not Present- Frequency, Painful Urination, Pelvic Pain, Vaginal Bleeding, Vaginal Discharge, Contractions, regular, Fetal Movements Decreased, Urinary Complaints and Vaginal Fluid. Musculoskeletal: Not Present- Back Pain and Leg Cramps. Neurological: Not Present- Dizziness. Psychiatric: Not Present- Depression.     Objective:   Blood  pressure 93/63, pulse 91, weight 120 lb 11.2 oz (54.7 kg), unknown if currently breastfeeding.  Body mass index is 21.38 kg/m.  General Appearance:    Alert, cooperative, no distress, appears stated age  Head:    Normocephalic, without obvious abnormality, atraumatic  Eyes:    PERRL, conjunctiva/corneas clear, EOM's intact, both eyes  Ears:    Normal external ear canals, both ears  Nose:   Nares normal, septum midline, mucosa normal, no drainage or sinus tenderness  Throat:   Lips, mucosa, and tongue normal; teeth and gums normal  Neck:   Supple, symmetrical, trachea midline, no adenopathy; thyroid: no enlargement/tenderness/nodules; no carotid bruit or JVD  Back:     Symmetric, no curvature, ROM normal, no CVA tenderness  Lungs:     Clear to auscultation bilaterally, respirations unlabored  Chest Wall:    No tenderness or deformity   Heart:    Regular rate and rhythm, S1 and S2 normal, no murmur, rub or gallop  Breast Exam:    No tenderness, masses, or nipple abnormality  Abdomen:     Soft, non-tender, bowel sounds active all four quadrants, no masses, no organomegaly. FHT 149 bpm.  Genitalia:    Pelvic:external genitalia normal, vagina without lesions, discharge, or tenderness, rectovaginal septum  normal. Cervix normal in appearance, no cervical motion tenderness, no adnexal masses or tenderness.  Pregnancy positive findings: uterine enlargement: 16-18 wk size, nontender.   Rectal:    Normal external sphincter.  No hemorrhoids appreciated. Internal exam not done.   Extremities:   Extremities normal, atraumatic, no cyanosis or edema  Pulses:   2+ and symmetric all extremities  Skin:   Skin color, texture, turgor normal, no rashes or lesions  Lymph nodes:   Cervical, supraclavicular, and axillary nodes normal  Neurologic:   CNII-XII intact, normal strength, sensation and reflexes throughout      Assessment:   1. Encounter for supervision of other normal pregnancy in second trimester    2. Suboxone maintenance treatment complicating pregnancy, antepartum (HCC)   3. Cervical cancer screening   4. Encounter for drug screening   5. Screening examination for STD (sexually transmitted disease)     Plan:   1. Supervision of normal pregnancy  -  Initial labs ordered. - Prenatal vitamins encouraged. - Problem list reviewed and updated. - New OB counseling:  The patient has been given an overview regarding routine prenatal care.  Recommendations regarding diet, weight gain, and exercise in pregnancy were given. - Prenatal testing, optional genetic testing, and ultrasound use in pregnancy were reviewed.  Cell-free DNA testing discussed: ordered [id number] testing. - Benefits of Breast Feeding were discussed. The patient is encouraged to consider nursing her baby post partum if no other contraindications.  2. Suboxone maintenance complicating pregnancy - To continue use. Previously on 8 mg dosing, has weaned down  to 4 mg.  Will continu to monitor during pregnancy  3. Cervical cancer screening - Patient due for pap smear today, performed.   4. Of note, patient was previously dismissed from the practice due to high volume of no-shows in 2017.  Patient reports that she will be more present for visits. Was going through a "tough time" in her past with h/o drug use.   5. H/o anxiety. Currently denies complaints today. Will monitor during the pregnancy.  Follow up in 4 weeks.  The patient has Medicaid.  [facility] Medicaid Risk Screening Form completed today   50% of 30 min visit spent on counseling and coordination of care.     Hildred Laser, MD [facility]

## 2021-01-03 NOTE — Telephone Encounter (Signed)
Pt called no answer calling her to inform that she left her wallet at the doctor's office. Number on file is not a working number.

## 2021-01-03 NOTE — Telephone Encounter (Signed)
Pt's boyfriend called no answer. LM to inform Marilyn Gonzalez that she had left her wallet at the office and to please come by the office to do more blood work.

## 2021-01-03 NOTE — Telephone Encounter (Signed)
Pt called and she is aware that she left her wallet at the office and to get more blood work done. Pt needs [id number] and pick up her wallet. Pt is aware that she needs to be at the office by 4:15pm to have the blood work done.

## 2021-01-03 NOTE — Progress Notes (Signed)
Pt present for initial prenatal care late in pregnancy.

## 2021-01-04 LAB — TOXOPLASMA ANTIBODIES- IGG AND  IGM
Toxoplasma Antibody- IgM: <3 AU/mL (ref 0.0–7.9)
Toxoplasma IgG Ratio: 11.2 IU/mL — ABNORMAL HIGH (ref 0.0–7.1)

## 2021-01-04 LAB — CYTOLOGY - PAP: Diagnosis: NEGATIVE

## 2021-01-04 LAB — ABO AND RH: Rh Factor: POSITIVE

## 2021-01-04 LAB — VIRAL HEPATITIS HBV, HCV
HCV Ab: <0.1 s/co ratio (ref 0.0–0.9)
Hep B Core Total Ab: NEGATIVE
Hep B Surface Ab, Qual: REACTIVE
Hepatitis B Surface Ag: NEGATIVE

## 2021-01-04 LAB — HCV INTERPRETATION

## 2021-01-04 LAB — RPR: RPR Ser Ql: NONREACTIVE

## 2021-01-04 LAB — DRUG PROFILE, UR, 9 DRUGS (LABCORP)
Amphetamines, Urine: NEGATIVE ng/mL
Barbiturate Quant, Ur: NEGATIVE ng/mL
Benzodiazepine Quant, Ur: NEGATIVE ng/mL
Cannabinoid Quant, Ur: NEGATIVE ng/mL
Cocaine (Metab.): NEGATIVE ng/mL
Methadone Screen, Urine: NEGATIVE ng/mL
Opiate Quant, Ur: NEGATIVE ng/mL
PCP Quant, Ur: NEGATIVE ng/mL
Propoxyphene: NEGATIVE ng/mL

## 2021-01-04 LAB — URINALYSIS, ROUTINE W REFLEX MICROSCOPIC
Bilirubin, UA: NEGATIVE
Glucose, UA: NEGATIVE
Nitrite, UA: POSITIVE — AB
RBC, UA: NEGATIVE
Specific Gravity, UA: >=1.03 — AB (ref 1.005–1.030)
Urobilinogen, Ur: 1 mg/dL (ref 0.2–1.0)
pH, UA: 6.5 (ref 5.0–7.5)

## 2021-01-04 LAB — VARICELLA ZOSTER ANTIBODY, IGG: Varicella zoster IgG: 801 index (ref >165)

## 2021-01-04 LAB — GC/CHLAMYDIA PROBE AMP
Chlamydia trachomatis, NAA: NEGATIVE
Neisseria Gonorrhoeae by PCR: NEGATIVE

## 2021-01-04 LAB — RUBELLA SCREEN: Rubella Antibodies, IGG: 5.41 index (ref >0.99)

## 2021-01-04 LAB — MICROSCOPIC EXAMINATION
Bacteria, UA: NONE SEEN
Casts: NONE SEEN /lpf
Epithelial Cells (non renal): >10 /hpf — AB (ref 0–10)

## 2021-01-04 LAB — NICOTINE SCREEN, URINE: Cotinine Ql Scrn, Ur: POSITIVE ng/mL — AB

## 2021-01-04 LAB — ANTIBODY SCREEN: Antibody Screen: NEGATIVE

## 2021-01-04 LAB — HIV ANTIBODY (ROUTINE TESTING W REFLEX): HIV Screen 4th Generation wRfx: NONREACTIVE

## 2021-01-05 LAB — URINE CULTURE, OB REFLEX

## 2021-01-05 LAB — CULTURE, OB URINE

## 2021-01-05 MED ORDER — NITROFURANTOIN MONOHYD MACRO 100 MG PO CAPS: 100.0000 mg | ORAL_CAPSULE | Freq: Two times a day (BID) | ORAL | 0 refills | Status: AC

## 2021-01-05 NOTE — Addendum Note (Signed)
Addended by: Fabian November on: 01/05/2021 09:05 PM   Modules accepted: Orders

## 2021-01-12 ENCOUNTER — Encounter: Payer: Medicaid Other | Admitting: Obstetrics and Gynecology

## 2021-01-13 ENCOUNTER — Other Ambulatory Visit: Payer: Self-pay

## 2021-01-13 ENCOUNTER — Other Ambulatory Visit: Payer: Medicaid Other

## 2021-01-13 DIAGNOSIS — O0972 Supervision of high risk pregnancy due to social problems, second trimester: Secondary | ICD-10-CM

## 2021-01-19 LAB — MATERNIT 21 PLUS CORE, BLOOD
Fetal Fraction: 7
Result (T21): NEGATIVE
Trisomy 13 (Patau syndrome): NEGATIVE
Trisomy 18 (Edwards syndrome): NEGATIVE
Trisomy 21 (Down syndrome): NEGATIVE

## 2021-01-31 ENCOUNTER — Other Ambulatory Visit: Payer: Self-pay

## 2021-01-31 ENCOUNTER — Ambulatory Visit (INDEPENDENT_AMBULATORY_CARE_PROVIDER_SITE_OTHER): Payer: Medicaid Other | Admitting: Obstetrics and Gynecology

## 2021-01-31 ENCOUNTER — Telehealth: Payer: Self-pay | Admitting: Obstetrics and Gynecology

## 2021-01-31 VITALS — BP 104/69 | HR 120 | Wt 126.8 lb

## 2021-01-31 DIAGNOSIS — Z3A21 21 weeks gestation of pregnancy: Secondary | ICD-10-CM

## 2021-01-31 DIAGNOSIS — O0972 Supervision of high risk pregnancy due to social problems, second trimester: Secondary | ICD-10-CM

## 2021-01-31 LAB — POCT URINALYSIS DIPSTICK OB
Bilirubin, UA: NEGATIVE
Blood, UA: NEGATIVE
Glucose, UA: NEGATIVE
Ketones, UA: NEGATIVE
Leukocytes, UA: NEGATIVE
Nitrite, UA: NEGATIVE
POC,PROTEIN,UA: NEGATIVE
Spec Grav, UA: 1.025 (ref 1.010–1.025)
Urobilinogen, UA: 0.2 E.U./dL
pH, UA: 6.5 (ref 5.0–8.0)
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
## 2021-02-01 ENCOUNTER — Other Ambulatory Visit: Payer: Self-pay | Admitting: Obstetrics and Gynecology

## 2021-02-01 DIAGNOSIS — Z3689 Encounter for other specified antenatal screening: Secondary | ICD-10-CM

## 2021-02-01 DIAGNOSIS — F1120 Opioid dependence, uncomplicated: Secondary | ICD-10-CM

## 2021-02-15 LAB — MONITOR DRUG PROFILE 14(MW)
Amphetamine Scrn, Ur: NEGATIVE ng/mL
BARBITURATE SCREEN URINE: NEGATIVE ng/mL
BENZODIAZEPINE SCREEN, URINE: NEGATIVE ng/mL
CANNABINOIDS UR QL SCN: NEGATIVE ng/mL
Cocaine (Metab) Scrn, Ur: NEGATIVE ng/mL
Creatinine(Crt), U: 223.4 mg/dL (ref 20.0–300.0)
Fentanyl, Urine: NEGATIVE pg/mL
Meperidine Screen, Urine: NEGATIVE ng/mL
Methadone Screen, Urine: NEGATIVE ng/mL
OXYCODONE+OXYMORPHONE UR QL SCN: NEGATIVE ng/mL
Ph of Urine: 5.7 (ref 4.5–8.9)
Phencyclidine Qn, Ur: NEGATIVE ng/mL
Propoxyphene Scrn, Ur: NEGATIVE ng/mL
SPECIFIC GRAVITY: 1.028
Tramadol Screen, Urine: NEGATIVE ng/mL

## 2021-02-15 LAB — OPIATES CONFIRMATION, URINE
Codeine: NEGATIVE
Hydrocodone: NEGATIVE
Hydromorphone: NEGATIVE
MORPHINE CONFIRM: >3000 ng/mL
Morphine: POSITIVE — AB
Opiates: POSITIVE ng/mL — AB

## 2021-02-15 LAB — BUPRENORPHINE CONFIRM, URINE
Buprenorphine Confirm: 488 ng/mL
Buprenorphine: POSITIVE — AB
Buprenorphine: POSITIVE — AB
Norbuprenorphine Confirm: 1642 ng/mL
Norbuprenorphine: POSITIVE — AB

## 2021-02-28 ENCOUNTER — Encounter: Payer: Medicaid Other | Admitting: Obstetrics and Gynecology

## 2021-03-09 ENCOUNTER — Ambulatory Visit: Payer: Medicaid Other

## 2023-01-12 IMAGING — US US OB TRANSVAGINAL
1 series · 14 of 28 positions shown · non-contrast
Comparison: None.

CLINICAL DATA: Initial evaluation for acute lower abdominal pain,
early pregnancy.

EXAM:
OBSTETRIC <14 WK US AND TRANSVAGINAL OB US
TECHNIQUE: Both transabdominal and transvaginal ultrasound examinations were
performed for complete evaluation of the gestation as well as the
maternal uterus, adnexal regions, and pelvic cul-de-sac.
Transvaginal technique was performed to assess early pregnancy.

[Series 1: us ob comp less 14 wks · 14 of 30 slices shown]
[im 2/30]
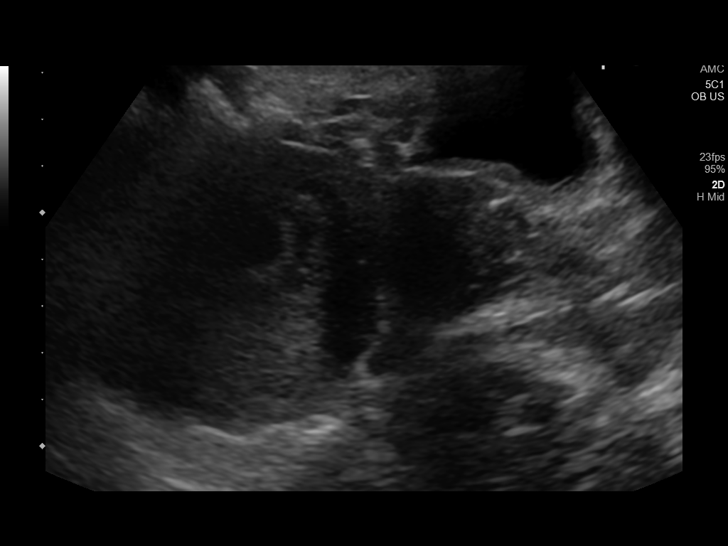
[im 4/30]
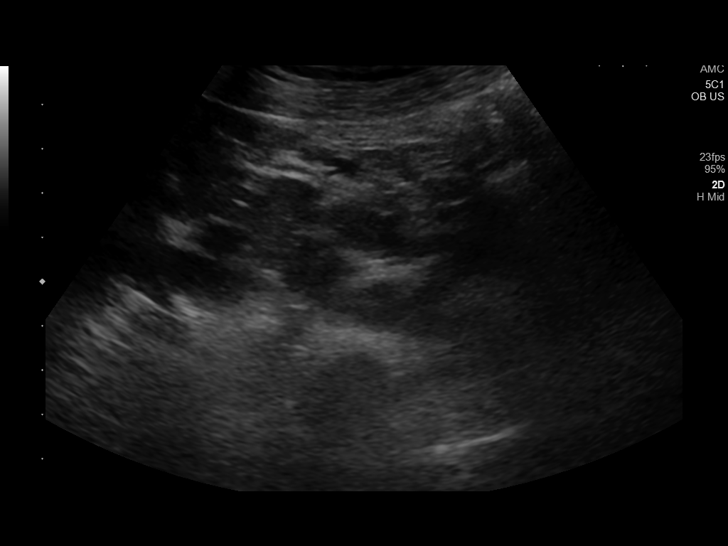
[im 6/30]
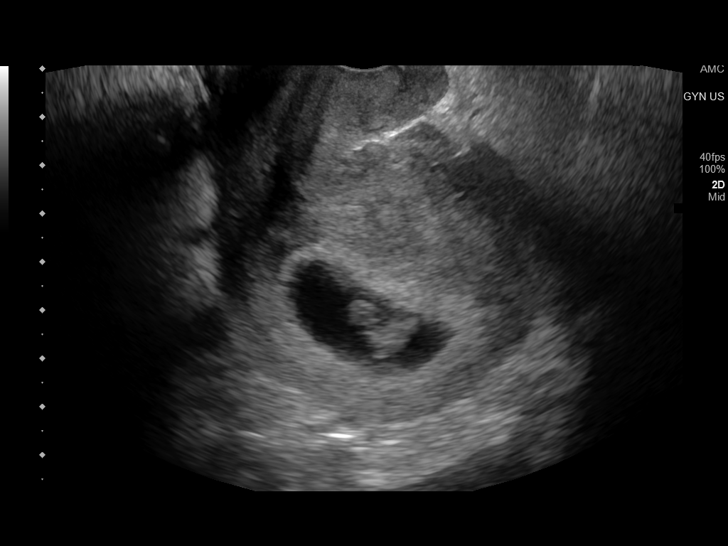
[im 8/30]
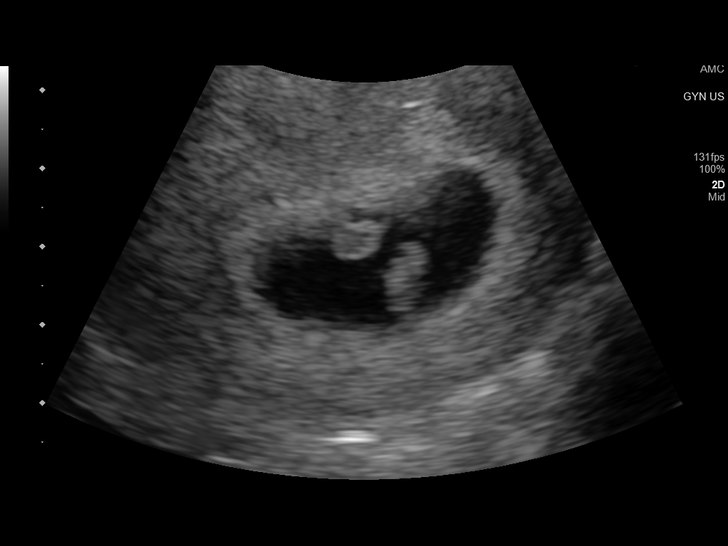
[im 10/30]
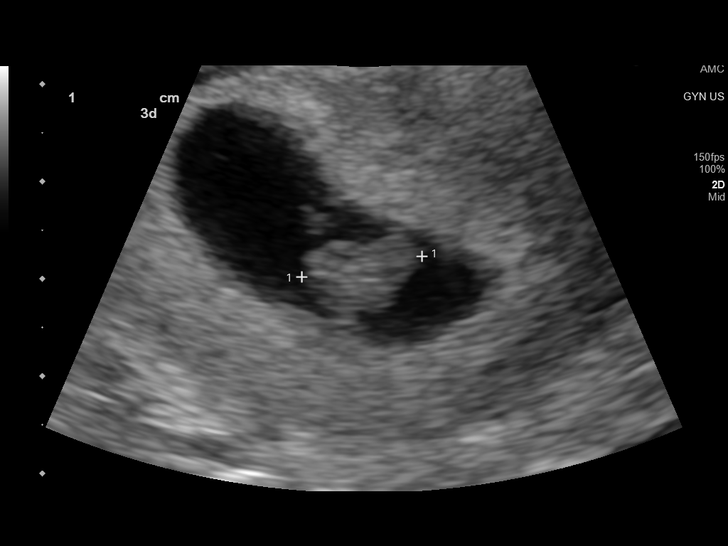
[im 12/30]
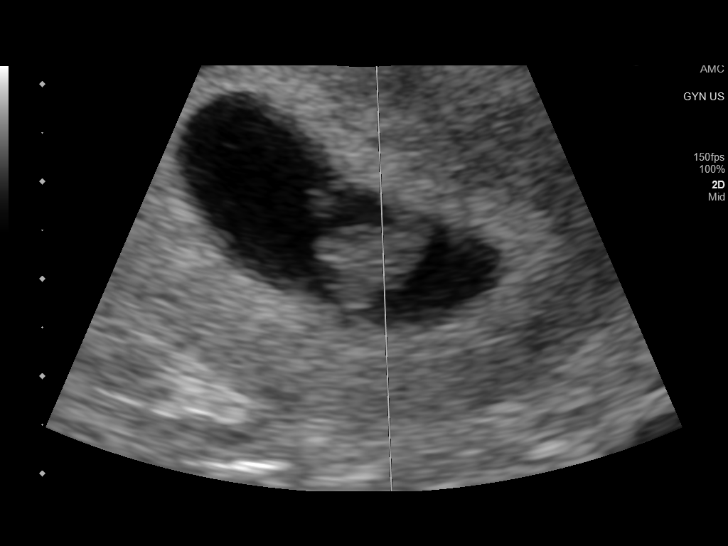
[im 14/30]
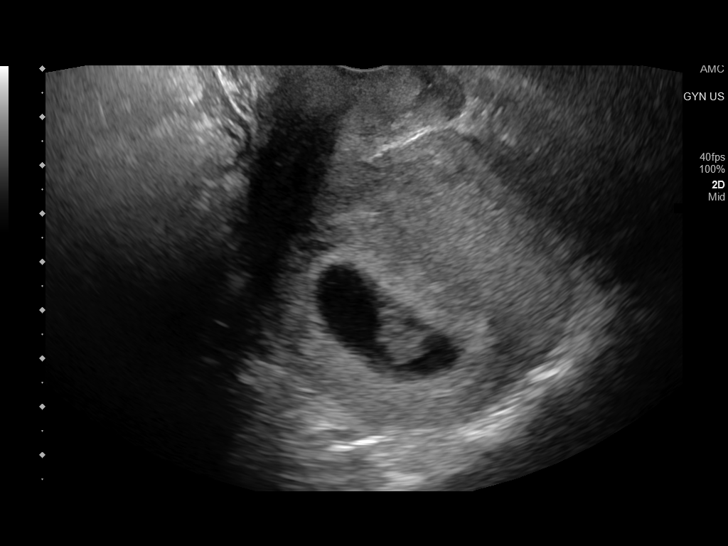
[im 17/30]
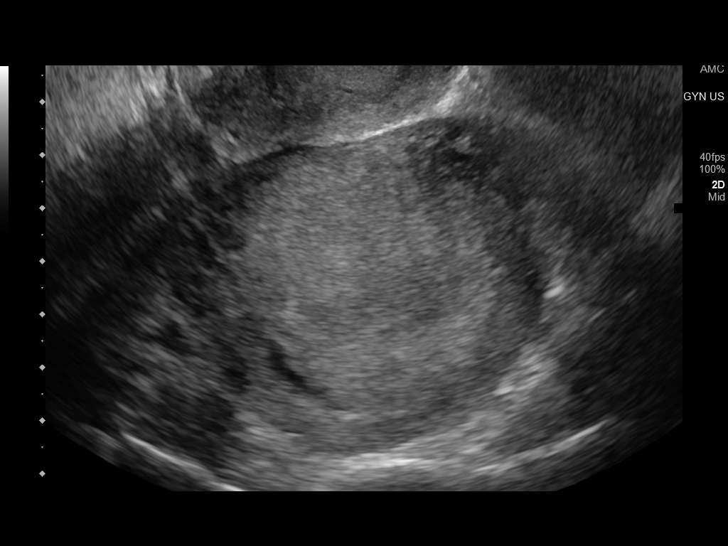
[im 19/30]
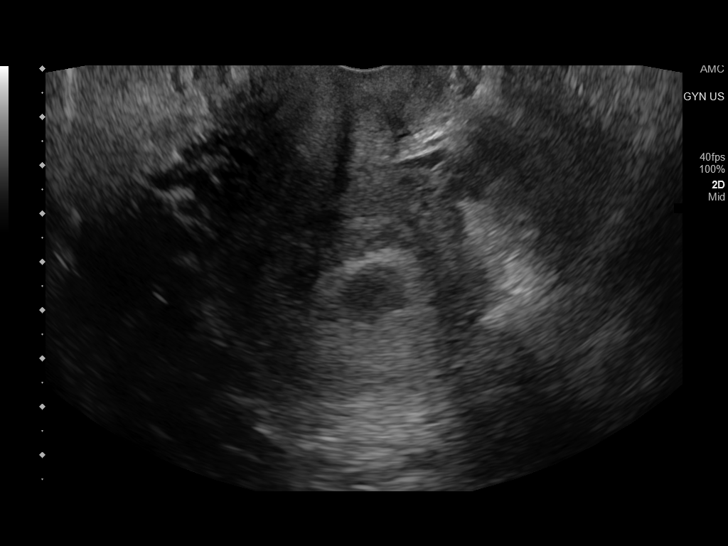
[im 21/30]
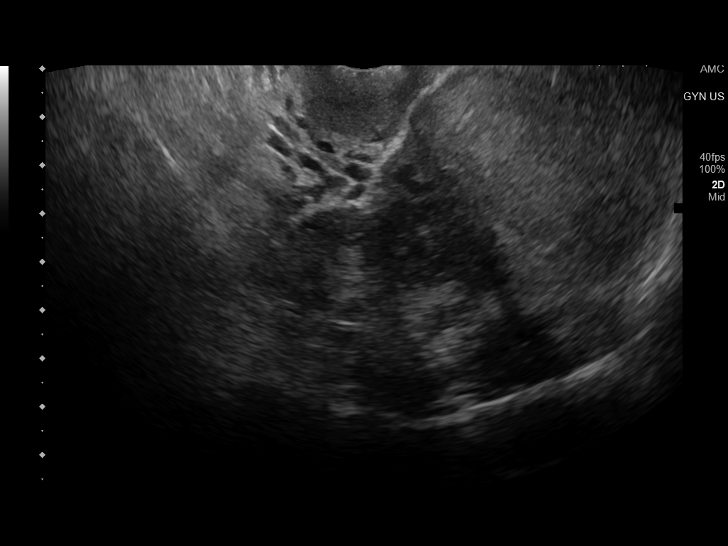
[im 23/30]
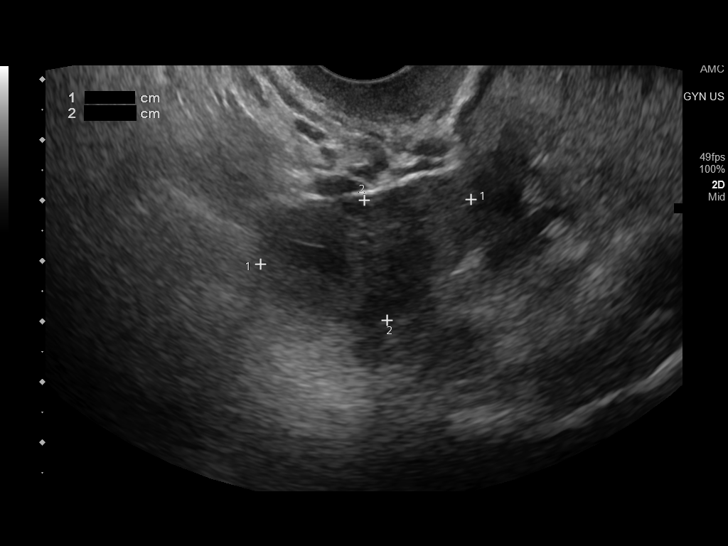
[im 25/30]
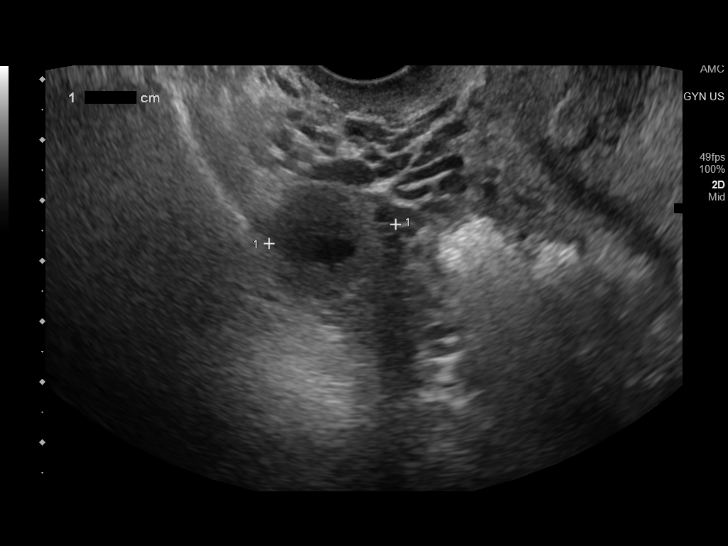
[im 27/30]
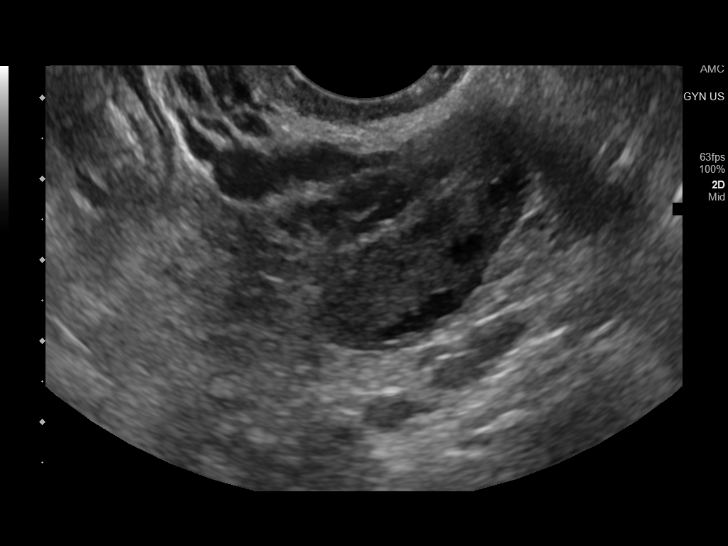
[im 30/30]
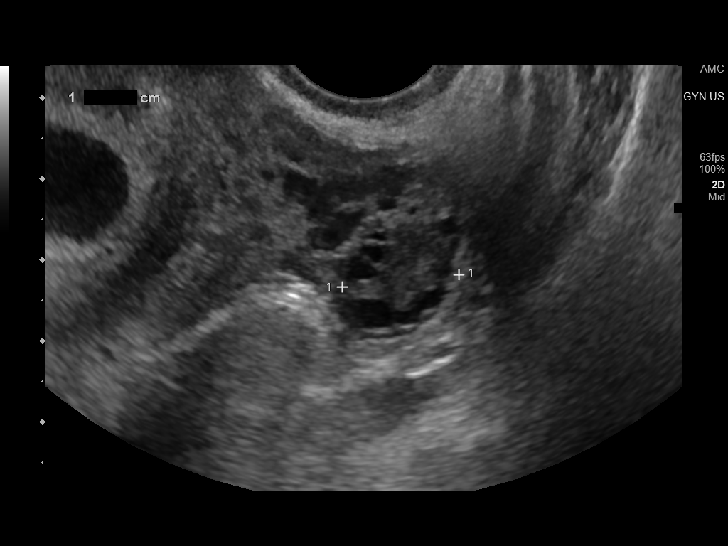

[14 of 28 positions shown; findings below may reference images not displayed]

FINDINGS: Intrauterine gestational sac: Single

Yolk sac:  Present

Embryo:  Present

Cardiac Activity: Present

Heart Rate: 140 bpm

CRL: 12.6 mm   7 w   3 d                  US EDC: 06/13/2021

Subchorionic hemorrhage: Small subchorionic hemorrhage without
associated mass effect.

Maternal uterus/adnexae: Ovaries within normal limits. Small corpus
luteal cyst noted on the right. No adnexal mass or free fluid.
IMPRESSION: 1. Single viable intrauterine pregnancy as above, estimated
gestational age 7 weeks and 3 days by crown-rump length, with
ultrasound EDC of 06/13/2021.
2. Small subchorionic hemorrhage without associated mass effect.
3. Small degenerating right ovarian corpus luteal cyst. No other
acute maternal uterine or adnexal abnormality.

## 2023-01-12 IMAGING — US US OB COMP LESS 14 WK
1 series · 13 of 28 positions shown · non-contrast
Comparison: None.

CLINICAL DATA: Initial evaluation for acute lower abdominal pain,
early pregnancy.

EXAM:
OBSTETRIC <14 WK US AND TRANSVAGINAL OB US
TECHNIQUE: Both transabdominal and transvaginal ultrasound examinations were
performed for complete evaluation of the gestation as well as the
maternal uterus, adnexal regions, and pelvic cul-de-sac.
Transvaginal technique was performed to assess early pregnancy.

[Series 1: us ob comp less 14 wks · 13 of 30 slices shown]
[im 2/30]
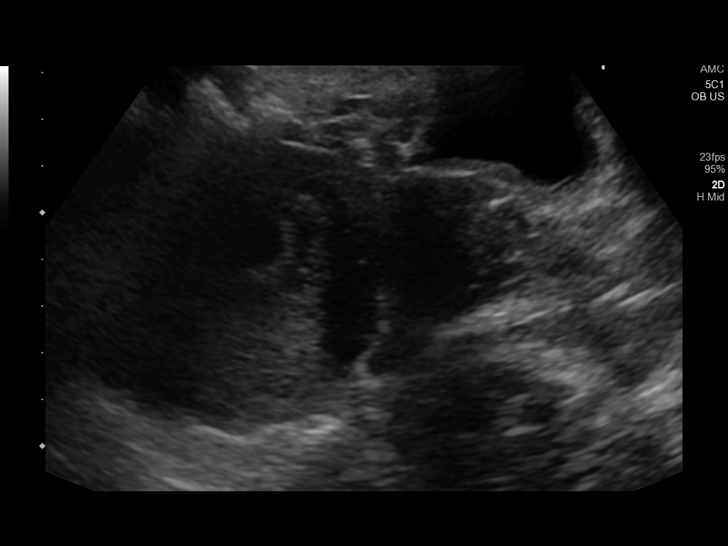
[im 4/30]
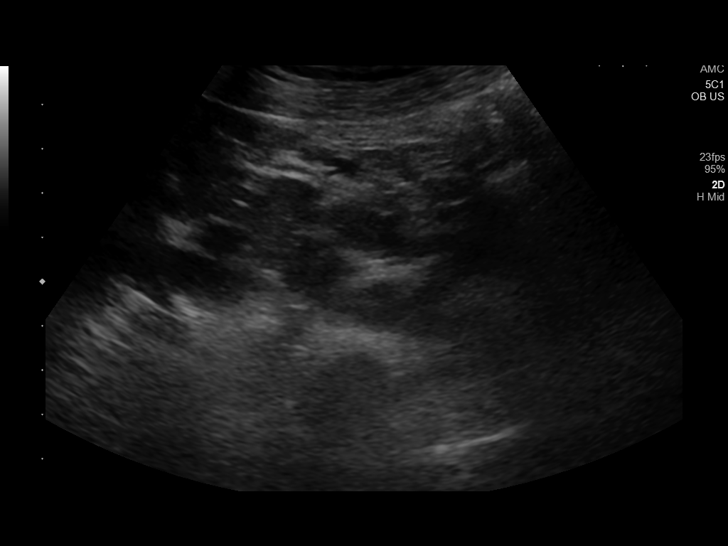
[im 6/30]
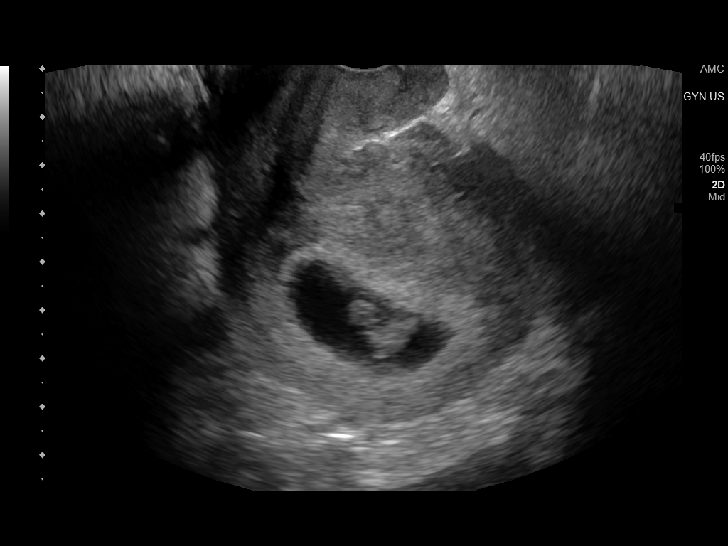
[im 8/30]
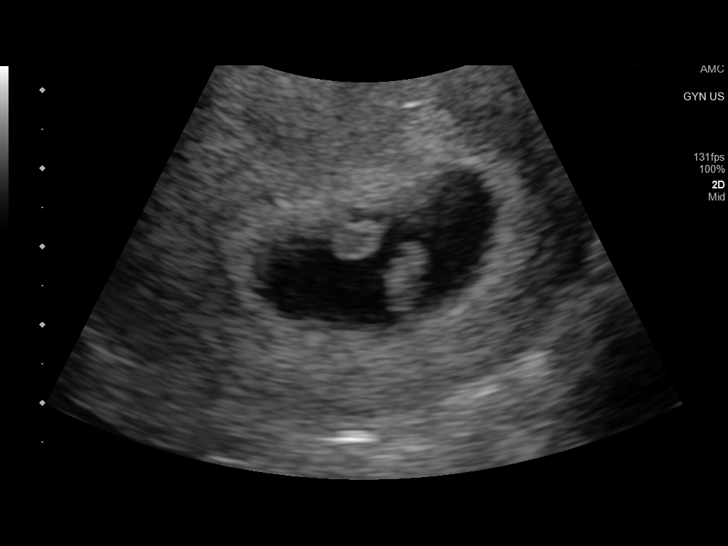
[im 10/30]
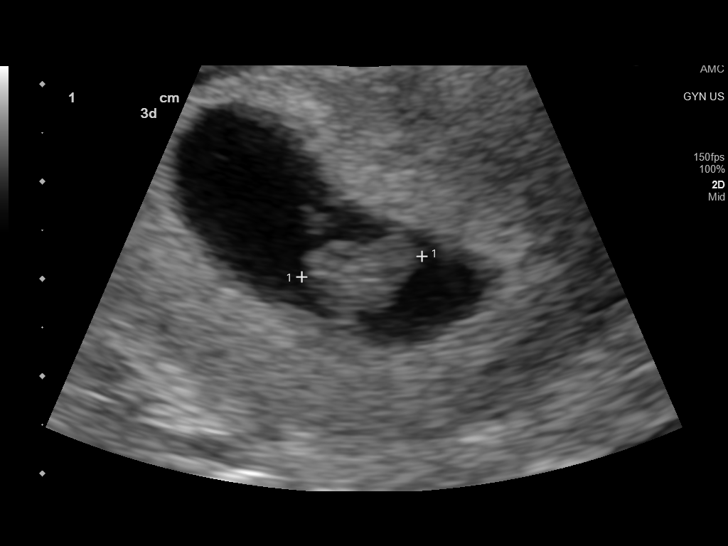
[im 12/30]
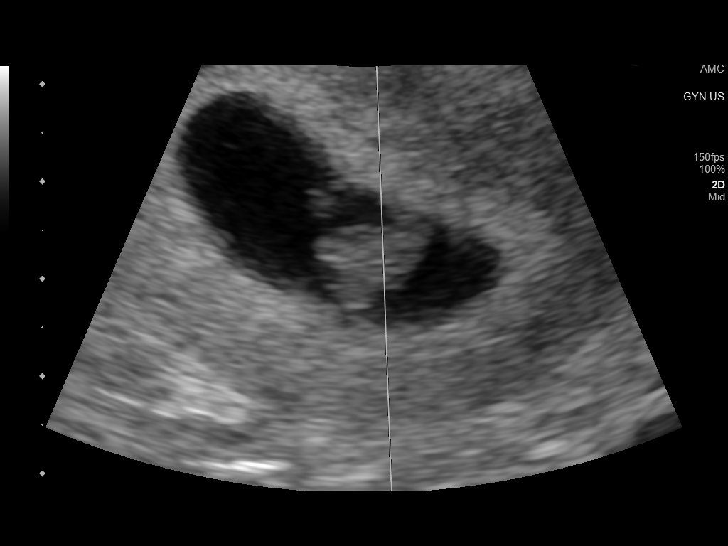
[im 16/30]
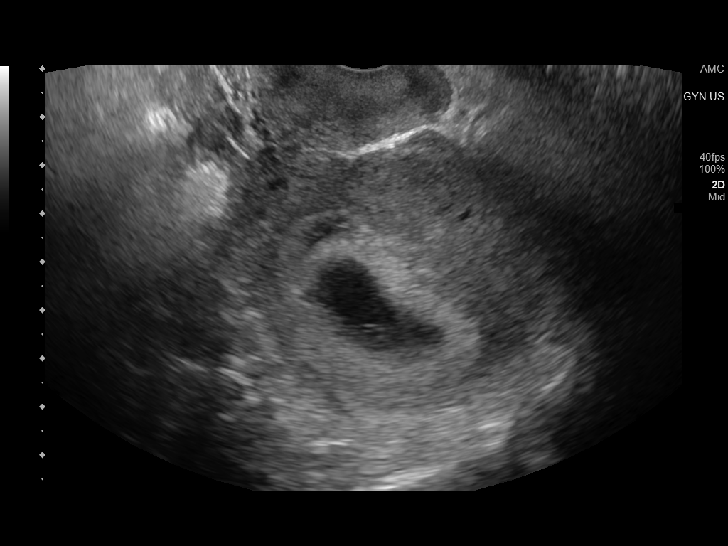
[im 18/30]
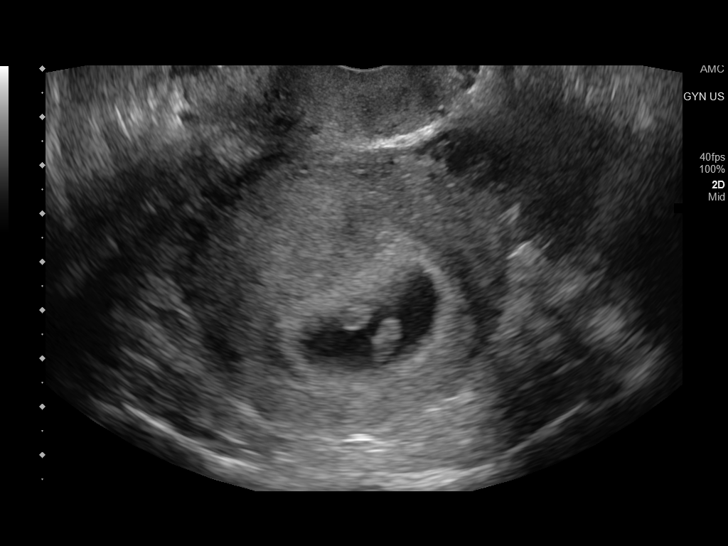
[im 20/30]
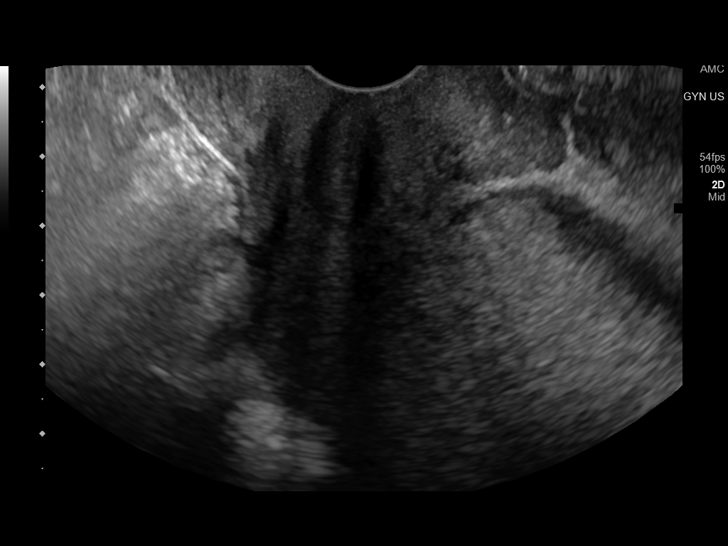
[im 22/30]
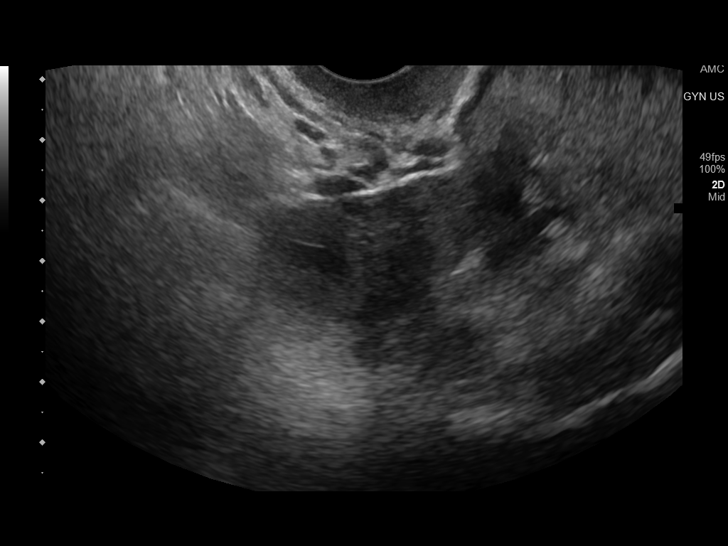
[im 24/30]
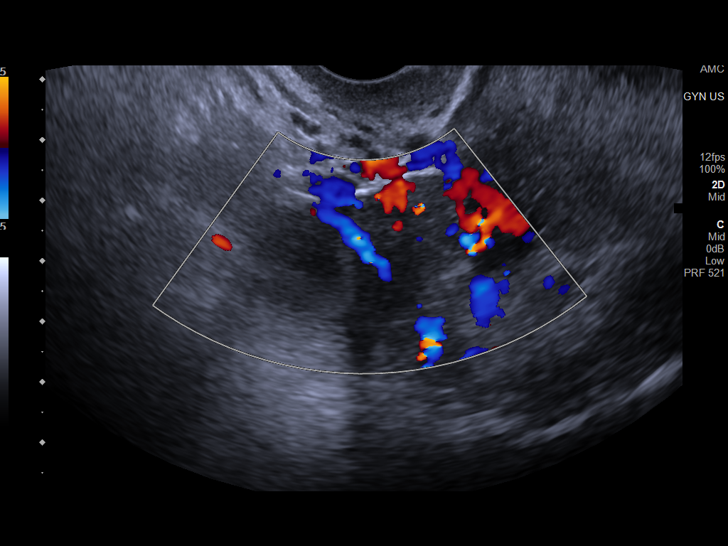
[im 26/30]
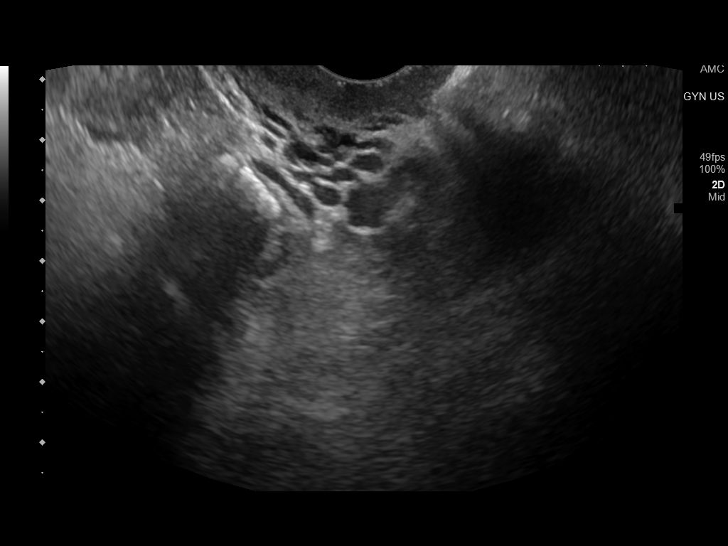
[im 28/30]
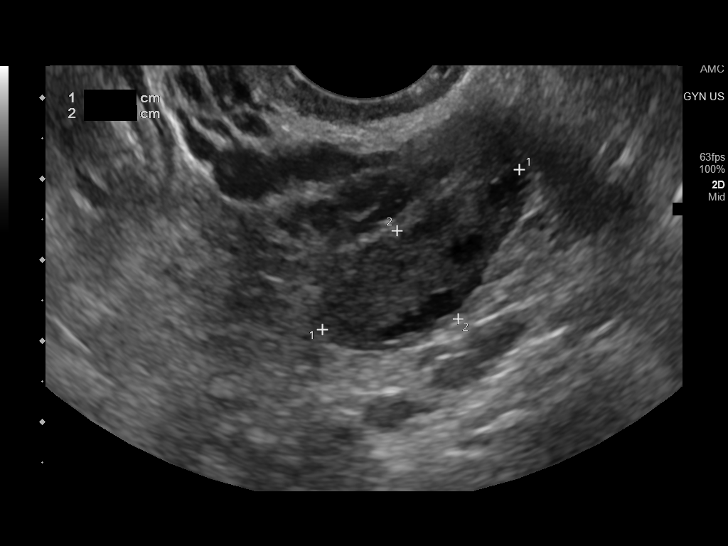

[13 of 28 positions shown; findings below may reference images not displayed]

FINDINGS: Intrauterine gestational sac: Single

Yolk sac:  Present

Embryo:  Present

Cardiac Activity: Present

Heart Rate: 140 bpm

CRL: 12.6 mm   7 w   3 d                  US EDC: 06/13/2021

Subchorionic hemorrhage: Small subchorionic hemorrhage without
associated mass effect.

Maternal uterus/adnexae: Ovaries within normal limits. Small corpus
luteal cyst noted on the right. No adnexal mass or free fluid.
IMPRESSION: 1. Single viable intrauterine pregnancy as above, estimated
gestational age 7 weeks and 3 days by crown-rump length, with
ultrasound EDC of 06/13/2021.
2. Small subchorionic hemorrhage without associated mass effect.
3. Small degenerating right ovarian corpus luteal cyst. No other
acute maternal uterine or adnexal abnormality.
